# Patient Record
Sex: Female | Born: 1955 | Race: White | Hispanic: No | Marital: Married | Smoking: Never smoker
Health system: Southern US, Community
[De-identification: ages and names within clinical notes are randomized; demographics above are authoritative.]

## PROBLEM LIST (undated history)

## (undated) DIAGNOSIS — F32A Depression, unspecified: Secondary | ICD-10-CM

## (undated) DIAGNOSIS — H8110 Benign paroxysmal vertigo, unspecified ear: Secondary | ICD-10-CM

## (undated) DIAGNOSIS — K209 Esophagitis, unspecified without bleeding: Secondary | ICD-10-CM

## (undated) DIAGNOSIS — E1169 Type 2 diabetes mellitus with other specified complication: Secondary | ICD-10-CM

## (undated) DIAGNOSIS — I10 Essential (primary) hypertension: Secondary | ICD-10-CM

## (undated) DIAGNOSIS — G4733 Obstructive sleep apnea (adult) (pediatric): Secondary | ICD-10-CM

## (undated) DIAGNOSIS — K140 Glossitis: Secondary | ICD-10-CM

## (undated) DIAGNOSIS — J309 Allergic rhinitis, unspecified: Secondary | ICD-10-CM

## (undated) DIAGNOSIS — E119 Type 2 diabetes mellitus without complications: Secondary | ICD-10-CM

## (undated) DIAGNOSIS — M7582 Other shoulder lesions, left shoulder: Secondary | ICD-10-CM

## (undated) DIAGNOSIS — N6320 Unspecified lump in the left breast, unspecified quadrant: Secondary | ICD-10-CM

## (undated) DIAGNOSIS — M7710 Lateral epicondylitis, unspecified elbow: Secondary | ICD-10-CM

## (undated) DIAGNOSIS — M1990 Unspecified osteoarthritis, unspecified site: Secondary | ICD-10-CM

## (undated) DIAGNOSIS — K5792 Diverticulitis of intestine, part unspecified, without perforation or abscess without bleeding: Secondary | ICD-10-CM

## (undated) DIAGNOSIS — M5126 Other intervertebral disc displacement, lumbar region: Secondary | ICD-10-CM

## (undated) DIAGNOSIS — K219 Gastro-esophageal reflux disease without esophagitis: Secondary | ICD-10-CM

## (undated) DIAGNOSIS — E559 Vitamin D deficiency, unspecified: Secondary | ICD-10-CM

## (undated) DIAGNOSIS — I517 Cardiomegaly: Secondary | ICD-10-CM

## (undated) DIAGNOSIS — E7800 Pure hypercholesterolemia, unspecified: Secondary | ICD-10-CM

## (undated) DIAGNOSIS — F329 Major depressive disorder, single episode, unspecified: Secondary | ICD-10-CM

## (undated) HISTORY — DX: Other shoulder lesions, left shoulder: M75.82

## (undated) HISTORY — DX: Pure hypercholesterolemia, unspecified: E78.00

## (undated) HISTORY — DX: Gastro-esophageal reflux disease without esophagitis: K21.9

## (undated) HISTORY — DX: Glossitis: K14.0

## (undated) HISTORY — PX: CHOLECYSTECTOMY: SHX55

## (undated) HISTORY — DX: Depression, unspecified: F32.A

## (undated) HISTORY — DX: Other intervertebral disc displacement, lumbar region: M51.26

## (undated) HISTORY — DX: Lateral epicondylitis, unspecified elbow: M77.10

## (undated) HISTORY — DX: Obstructive sleep apnea (adult) (pediatric): G47.33

## (undated) HISTORY — DX: Allergic rhinitis, unspecified: J30.9

## (undated) HISTORY — DX: Cardiomegaly: I51.7

## (undated) HISTORY — DX: Diverticulitis of intestine, part unspecified, without perforation or abscess without bleeding: K57.92

## (undated) HISTORY — DX: Type 2 diabetes mellitus with other specified complication: E11.69

## (undated) HISTORY — DX: Benign paroxysmal vertigo, unspecified ear: H81.10

## (undated) HISTORY — DX: Vitamin D deficiency, unspecified: E55.9

## (undated) HISTORY — DX: Unspecified lump in the left breast, unspecified quadrant: N63.20

## (undated) HISTORY — DX: Esophagitis, unspecified without bleeding: K20.90

## (undated) HISTORY — PX: CARPAL TUNNEL RELEASE: SHX101

---
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
## 2011-06-29 ENCOUNTER — Encounter: Payer: Self-pay | Admitting: Interventional Cardiology

## 2011-12-06 ENCOUNTER — Ambulatory Visit (INDEPENDENT_AMBULATORY_CARE_PROVIDER_SITE_OTHER): Payer: BC Managed Care – PPO | Admitting: Family Medicine

## 2011-12-06 VITALS — BP 130/89 | HR 88 | Temp 99.0°F | Resp 16 | Ht 65.0 in | Wt 195.8 lb

## 2011-12-06 DIAGNOSIS — J019 Acute sinusitis, unspecified: Secondary | ICD-10-CM

## 2011-12-06 DIAGNOSIS — J329 Chronic sinusitis, unspecified: Secondary | ICD-10-CM

## 2011-12-06 MED ORDER — LEVOFLOXACIN 500 MG PO TABS: 500.0000 mg | ORAL_TABLET | Freq: Every day | ORAL | Status: AC

## 2011-12-06 NOTE — Progress Notes (Signed)
@[email or web address] @   Patient ID: Brittany Campos MRN: [id number], DOB: 04/17/1956, 56 y.o. Date of Encounter: 12/06/2011, 12:21 PM  Primary Physician: No primary provider on file.  Chief Complaint:  Chief Complaint  Patient presents with  . ear pressure    both ears  . sinus pressure    in head  . Sore Throat    HPI: 56 y.o. year old female presents with 21 day history of nasal congestion, post nasal drip, sore throat, sinus pressure, and cough. Afebrile. No chills. Nasal congestion thick and green/yellow. Sinus pressure is the worst symptom. Cough is productive secondary to post nasal drip and not associated with time of day. Ears feel full, leading to sensation of muffled hearing. Has tried OTC cold preps without success. No GI complaints. Appetite normal.   She was recently treated with Biaxin where she was located in [place], and subsequently had a minute clinic here in [place] with a Z-Pak. Neither of them made any difference nor did help in the past. She's had a history of sinus problems and at one point considered surgery on her sinuses   No leg trauma, sedentary periods, h/o cancer, or tobacco use.  No past medical history on file.   Home Meds: Prior to Admission medications   Medication Sig Start Date End Date Taking? Authorizing Provider  cetirizine-pseudoephedrine (ZYRTEC-D) 5-120 MG per tablet Take 1 tablet by mouth 2 (two) times daily.   Yes Historical Provider, MD  fluticasone (FLONASE) 50 MCG/ACT nasal spray Place 2 sprays into the nose as needed.   Yes Historical Provider, MD  lisinopril-hydrochlorothiazide (PRINZIDE,ZESTORETIC) 10-12.5 MG per tablet Take 1 tablet by mouth daily.   Yes Historical Provider, MD  Multiple Vitamins-Minerals (MULTIVITAMIN WITH MINERALS) tablet Take 1 tablet by mouth daily.   Yes Historical Provider, MD  omeprazole (PRILOSEC OTC) 20 MG tablet Take 20 mg by mouth daily.   Yes Historical Provider, MD  ramipril (ALTACE) 5 MG capsule Take 5 mg by mouth  daily.   Yes Historical Provider, MD    Allergies: Not on File  History   Social History  . Marital Status: Married    Spouse Name: N/A    Number of Children: N/A  . Years of Education: N/A   Occupational History  . Not on file.   Social History Main Topics  . Smoking status: Never Smoker   . Smokeless tobacco: Not on file  . Alcohol Use: Not on file  . Drug Use: Not on file  . Sexually Active: Not on file   Other Topics Concern  . Not on file   Social History Narrative  . No narrative on file     Review of Systems: Constitutional: negative for chills, fever, night sweats or weight changes Cardiovascular: negative for chest pain or palpitations Respiratory: negative for hemoptysis, wheezing, or shortness of breath Abdominal: negative for abdominal pain, nausea, vomiting or diarrhea Dermatological: negative for rash Neurologic: negative for headache   Physical Exam: Blood pressure 130/89, pulse 88, temperature 99 F (37.2 C), temperature source Oral, resp. rate 16, height 5\' 5"  (1.651 m), weight 195 lb 12.8 oz (88.814 kg), SpO2 98.00%., Body mass index is 32.58 kg/(m^2). General: Well developed, well nourished, in no acute distress. Head: Normocephalic, atraumatic, eyes without discharge, sclera non-icteric, nares are congested. Bilateral auditory canals clear, TM's are without perforation, pearly grey with reflective cone of light bilaterally. Serous effusion bilaterally behind TM's. Maxillary sinus TTP. Oral cavity moist, dentition normal. Posterior pharynx with post nasal drip and  mild erythema. No peritonsillar abscess or tonsillar exudate. Neck: Supple. No thyromegaly. Full ROM. No lymphadenopathy. Lungs: Clear bilaterally to auscultation without wheezes, rales, or rhonchi. Breathing is unlabored.  Heart: RRR with S1 S2. No murmurs, rubs, or gallops appreciated. Msk:  Strength and tone normal for age. Extremities: No clubbing or cyanosis. No edema. Neuro: Alert  and oriented X 3. Moves all extremities spontaneously. CNII-XII grossly in tact. Psych:  Responds to questions appropriately with a normal affect.   Labs:   ASSESSMENT AND PLAN:  56 y.o. year old female with sinusitis, chronic and ongoing after 2 rounds of macrolide antibiotics. She has not responded to Flonase and I believe she needs a stronger antibiotic. - -Continue Flonase -Tylenol/Motrin prn -Rest/fluids 1. Sinusitis  levofloxacin (LEVAQUIN) 500 MG tablet  If no better, start prednisone 20 mg ii daily with food x 5 days  Signed, Elvina Sidle, MD 12/06/2011 12:21 PM   .

## 2012-01-31 ENCOUNTER — Other Ambulatory Visit (HOSPITAL_COMMUNITY)
Admission: RE | Admit: 2012-01-31 | Discharge: 2012-01-31 | Disposition: A | Discharge status: Home / Self Care | Payer: BC Managed Care – PPO | Source: Ambulatory Visit | Attending: Family Medicine | Admitting: Family Medicine

## 2012-01-31 DIAGNOSIS — Z124 Encounter for screening for malignant neoplasm of cervix: Secondary | ICD-10-CM | POA: Insufficient documentation

## 2012-01-31 DIAGNOSIS — Z1151 Encounter for screening for human papillomavirus (HPV): Secondary | ICD-10-CM | POA: Insufficient documentation

## 2012-02-01 ENCOUNTER — Other Ambulatory Visit: Payer: Self-pay | Admitting: Family Medicine

## 2012-02-01 DIAGNOSIS — Z1231 Encounter for screening mammogram for malignant neoplasm of breast: Secondary | ICD-10-CM

## 2012-02-17 ENCOUNTER — Ambulatory Visit
Admission: RE | Admit: 2012-02-17 | Discharge: 2012-02-17 | Disposition: A | Discharge status: Home / Self Care | Payer: BC Managed Care – PPO | Source: Ambulatory Visit | Attending: Family Medicine | Admitting: Family Medicine

## 2012-02-17 DIAGNOSIS — Z1231 Encounter for screening mammogram for malignant neoplasm of breast: Secondary | ICD-10-CM

## 2012-02-21 ENCOUNTER — Other Ambulatory Visit: Payer: Self-pay | Admitting: Family Medicine

## 2012-02-21 DIAGNOSIS — R928 Other abnormal and inconclusive findings on diagnostic imaging of breast: Secondary | ICD-10-CM

## 2012-02-24 ENCOUNTER — Ambulatory Visit
Admission: RE | Admit: 2012-02-24 | Discharge: 2012-02-24 | Disposition: A | Discharge status: Home / Self Care | Payer: BC Managed Care – PPO | Source: Ambulatory Visit | Attending: Family Medicine | Admitting: Family Medicine

## 2012-02-24 DIAGNOSIS — R928 Other abnormal and inconclusive findings on diagnostic imaging of breast: Secondary | ICD-10-CM

## 2012-07-01 ENCOUNTER — Encounter (HOSPITAL_COMMUNITY): Payer: Self-pay

## 2012-07-01 ENCOUNTER — Encounter: Payer: Self-pay | Admitting: Interventional Cardiology

## 2012-07-01 ENCOUNTER — Emergency Department (HOSPITAL_COMMUNITY)
Admission: EM | Admit: 2012-07-01 | Discharge: 2012-07-01 | Disposition: A | Discharge status: Home / Self Care | Payer: BC Managed Care – PPO | Attending: Emergency Medicine | Admitting: Emergency Medicine

## 2012-07-01 DIAGNOSIS — I10 Essential (primary) hypertension: Secondary | ICD-10-CM | POA: Insufficient documentation

## 2012-07-01 DIAGNOSIS — Z79899 Other long term (current) drug therapy: Secondary | ICD-10-CM | POA: Insufficient documentation

## 2012-07-01 DIAGNOSIS — IMO0002 Reserved for concepts with insufficient information to code with codable children: Secondary | ICD-10-CM | POA: Insufficient documentation

## 2012-07-01 DIAGNOSIS — R42 Dizziness and giddiness: Secondary | ICD-10-CM | POA: Insufficient documentation

## 2012-07-01 DIAGNOSIS — R05 Cough: Secondary | ICD-10-CM | POA: Insufficient documentation

## 2012-07-01 DIAGNOSIS — R059 Cough, unspecified: Secondary | ICD-10-CM | POA: Insufficient documentation

## 2012-07-01 DIAGNOSIS — M129 Arthropathy, unspecified: Secondary | ICD-10-CM | POA: Insufficient documentation

## 2012-07-01 DIAGNOSIS — N390 Urinary tract infection, site not specified: Secondary | ICD-10-CM | POA: Insufficient documentation

## 2012-07-01 DIAGNOSIS — E119 Type 2 diabetes mellitus without complications: Secondary | ICD-10-CM | POA: Insufficient documentation

## 2012-07-01 DIAGNOSIS — E876 Hypokalemia: Secondary | ICD-10-CM

## 2012-07-01 HISTORY — DX: Essential (primary) hypertension: I10

## 2012-07-01 HISTORY — DX: Unspecified osteoarthritis, unspecified site: M19.90

## 2012-07-01 HISTORY — DX: Type 2 diabetes mellitus without complications: E11.9

## 2012-07-01 LAB — CBC WITH DIFFERENTIAL/PLATELET
Eosinophils Absolute: 0.1 10*3/uL (ref 0.0–0.7)
Lymphs Abs: 2.6 10*3/uL (ref 0.7–4.0)
MCH: 30.6 pg (ref 26.0–34.0)
Neutrophils Relative %: 60 % (ref 43–77)
Platelets: 240 10*3/uL (ref 150–400)
RBC: 4.38 MIL/uL (ref 3.87–5.11)
WBC: 8.2 10*3/uL (ref 4.0–10.5)

## 2012-07-01 LAB — COMPREHENSIVE METABOLIC PANEL
ALT: 32 U/L (ref 0–35)
AST: 25 U/L (ref 0–37)
Albumin: 3.6 g/dL (ref 3.5–5.2)
Alkaline Phosphatase: 71 U/L (ref 39–117)
Glucose, Bld: 173 mg/dL — ABNORMAL HIGH (ref 70–99)
Potassium: 3 mEq/L — ABNORMAL LOW (ref 3.5–5.1)
Sodium: 134 mEq/L — ABNORMAL LOW (ref 135–145)
Total Protein: 7.2 g/dL (ref 6.0–8.3)

## 2012-07-01 LAB — POCT I-STAT TROPONIN I: Troponin i, poc: 0 ng/mL (ref 0.00–0.08)

## 2012-07-01 LAB — URINALYSIS, ROUTINE W REFLEX MICROSCOPIC
Bilirubin Urine: NEGATIVE
Glucose, UA: NEGATIVE mg/dL
Hgb urine dipstick: NEGATIVE
Ketones, ur: 15 mg/dL — AB
pH: 6 (ref 5.0–8.0)

## 2012-07-01 LAB — URINE MICROSCOPIC-ADD ON

## 2012-07-01 MED ORDER — CIPROFLOXACIN HCL 500 MG PO TABS: 500.0000 mg | ORAL_TABLET | Freq: Two times a day (BID) | ORAL | Status: DC

## 2012-07-01 MED ORDER — POTASSIUM CHLORIDE CRYS ER 20 MEQ PO TBCR
10.0000 meq | EXTENDED_RELEASE_TABLET | Freq: Once | ORAL | Status: AC
  Administered 2012-07-01: 10 meq via ORAL
  Filled 2012-07-01: qty 1

## 2012-07-01 MED ORDER — POTASSIUM CHLORIDE ER 10 MEQ PO TBCR: 10.0000 meq | EXTENDED_RELEASE_TABLET | Freq: Two times a day (BID) | ORAL | Status: DC

## 2012-07-01 MED ORDER — CIPROFLOXACIN HCL 500 MG PO TABS
500.0000 mg | ORAL_TABLET | Freq: Once | ORAL | Status: AC
  Administered 2012-07-01: 500 mg via ORAL
  Filled 2012-07-01: qty 1

## 2012-07-01 NOTE — ED Notes (Signed)
Dr. Ignacia Palma notified pt's potassium 3.0.

## 2012-07-01 NOTE — ED Notes (Signed)
Pt went to her PCP 9 days ago and was diagnosed with a viral infection and gave prescriptions.  Pt presented again today for worsening symptoms.  Dr. Clarene Duke from [facility] walk in clinic called and stated that pt's CXR showed cardiomegaly.  Pt sent here for further eval.  Pt alert and oriented and in NAD.

## 2012-07-01 NOTE — ED Notes (Signed)
Pt c/o worsening fatigue. Was recently diagnosed with viral illness sx have been getting worse instead of better.

## 2012-07-01 NOTE — ED Provider Notes (Signed)
History     CSN: [id number]  Arrival date & time 07/01/12  1656   First MD Initiated Contact with Patient 07/01/12 1752      Chief Complaint  Patient presents with  . Shortness of Breath  . Dizziness    (Consider location/radiation/quality/duration/timing/severity/associated sxs/prior treatment) Patient is a 57 y.o. female presenting with shortness of breath. The history is provided by the patient (Records from [facility] No language interpreter was used.  Shortness of Breath  The current episode started 3 to 5 days ago. The onset was gradual. Episode frequency: Patient is a 58 year old woman who had upper respiratory infection.9 days ago treated with a Z-Pak. For 2 days she's had progressively worsening shortness of breath. The problem has been gradually worsening. The problem is moderate. Nothing relieves the symptoms. Nothing aggravates the symptoms. Associated symptoms include cough and shortness of breath. Pertinent negatives include no chest pain and no fever. Associated symptoms comments: There was no fever, mild cough. She tends to rest sitting up so it is impossible to know if she has orthopnea or paroxysmal nocturnal dyspnea.. She has not inhaled smoke recently. She has had no prior hospitalizations. She has had no prior ICU admissions. She has had no prior intubations. Her past medical history does not include asthma or past wheezing. Recently, medical care has been given by the PCP. Services received include medications given.    Past Medical History  Diagnosis Date  . Arthritis   . Diabetes mellitus without complication   . Hypertension     Past Surgical History  Procedure Date  . Cholecystectomy   . Carpal tunnel release     No family history on file.  History  Substance Use Topics  . Smoking status: Never Smoker   . Smokeless tobacco: Not on file  . Alcohol Use: Yes    OB History    Grav Para Term Preterm Abortions TAB SAB Ect Mult Living           Review of Systems  Constitutional: Negative.  Negative for fever and chills.  HENT: Negative.   Eyes: Negative.   Respiratory: Positive for cough and shortness of breath.   Cardiovascular: Negative.  Negative for chest pain and leg swelling.  Gastrointestinal: Negative.   Genitourinary: Negative.   Musculoskeletal: Negative.   Skin: Negative.   Neurological: Negative.   Psychiatric/Behavioral: Negative.     Allergies  Ampicillin  Home Medications   Current Outpatient Rx  Name  Route  Sig  Dispense  Refill  . VITAMIN C 1000 MG PO TABS   Oral   Take 1,000 mg by mouth daily.         Marland Kitchen FLUOXETINE HCL 20 MG PO CAPS   Oral   Take 20 mg by mouth daily.         Marland Kitchen FLUTICASONE PROPIONATE 50 MCG/ACT NA SUSP   Nasal   Place 2 sprays into the nose as needed. For stuffiness         . IBUPROFEN 200 MG PO TABS   Oral   Take 400-600 mg by mouth 2 (two) times daily as needed. For pain         . LISINOPRIL-HYDROCHLOROTHIAZIDE 20-12.5 MG PO TABS   Oral   Take 2 tablets by mouth daily.         . MELOXICAM 15 MG PO TABS   Oral   Take 15 mg by mouth daily.         . MULTI-VITAMIN/MINERALS PO  TABS   Oral   Take 1 tablet by mouth daily.         Marland Kitchen OMEPRAZOLE MAGNESIUM 20 MG PO TBEC   Oral   Take 20 mg by mouth 2 (two) times daily as needed. For acid reflux         . PSEUDOEPHEDRINE-GUAIFENESIN ER 60-600 MG PO TB12   Oral   Take 1 tablet by mouth 2 (two) times daily as needed. For congestion         . VITAMIN B-6 PO   Oral   Take 2 tablets by mouth daily.         Marland Kitchen VERAPAMIL HCL 80 MG PO TABS   Oral   Take 80 mg by mouth daily.           BP 139/87  Pulse 108  Temp 98.1 F (36.7 C) (Oral)  Resp 16  SpO2 98%  Physical Exam  Nursing note and vitals reviewed. Constitutional: She is oriented to person, place, and time.       Latest woman, no distress at rest.  HENT:  Head: Normocephalic and atraumatic.  Right Ear: External ear normal.    Left Ear: External ear normal.  Mouth/Throat: Oropharynx is clear and moist.  Eyes: Conjunctivae normal and EOM are normal. Pupils are equal, round, and reactive to light. No scleral icterus.  Neck: Normal range of motion.  Cardiovascular: Normal rate, regular rhythm and normal heart sounds.   Pulmonary/Chest: Effort normal and breath sounds normal. She has no wheezes. She has no rales.  Abdominal: Soft. Bowel sounds are normal.  Musculoskeletal: She exhibits no edema.  Neurological: She is alert and oriented to person, place, and time.       No sensory or motor deficit.  Skin: Skin is warm and dry.  Psychiatric: She has a normal mood and affect. Her behavior is normal.    ED Course  Procedures (including critical care time)  5:53 PM  Date: 07/01/2012  Rate: 101  Rhythm: sinus tachycardia  QRS Axis: normal  Intervals: normal QRS:  Poor R wave progression in precordial leads suggests possible old anterior myocardial infarction.  ST/T Wave abnormalities: normal  Conduction Disutrbances:none  Narrative Interpretation: Abnormal EKG  Old EKG Reviewed: none available  6:19 PM Patient was seen and had physical examination. Laboratory tests were ordered.  8:26 PM Results for orders placed during the hospital encounter of 07/01/12  CBC WITH DIFFERENTIAL      Component Value Range   WBC 8.2  4.0 - 10.5 K/uL   RBC 4.38  3.87 - 5.11 MIL/uL   Hemoglobin 13.4  12.0 - 15.0 g/dL   HCT 62.1  30.8 - 65.7 %   MCV 86.5  78.0 - 100.0 fL   MCH 30.6  26.0 - 34.0 pg   MCHC 35.4  30.0 - 36.0 g/dL   RDW 84.6  96.2 - 95.2 %   Platelets 240  150 - 400 K/uL   Neutrophils Relative 60  43 - 77 %   Neutro Abs 4.9  1.7 - 7.7 K/uL   Lymphocytes Relative 32  12 - 46 %   Lymphs Abs 2.6  0.7 - 4.0 K/uL   Monocytes Relative 7  3 - 12 %   Monocytes Absolute 0.5  0.1 - 1.0 K/uL   Eosinophils Relative 2  0 - 5 %   Eosinophils Absolute 0.1  0.0 - 0.7 K/uL   Basophils Relative 0  0 - 1 %   Basophils  Absolute 0.0  0.0 - 0.1 K/uL  COMPREHENSIVE METABOLIC PANEL      Component Value Range   Sodium 134 (*) 135 - 145 mEq/L   Potassium 3.0 (*) 3.5 - 5.1 mEq/L   Chloride 98  96 - 112 mEq/L   CO2 25  19 - 32 mEq/L   Glucose, Bld 173 (*) 70 - 99 mg/dL   BUN 23  6 - 23 mg/dL   Creatinine, Ser 1.61  0.50 - 1.10 mg/dL   Calcium 9.4  8.4 - 09.6 mg/dL   Total Protein 7.2  6.0 - 8.3 g/dL   Albumin 3.6  3.5 - 5.2 g/dL   AST 25  0 - 37 U/L   ALT 32  0 - 35 U/L   Alkaline Phosphatase 71  39 - 117 U/L   Total Bilirubin 0.4  0.3 - 1.2 mg/dL   GFR calc non Af Amer 61 (*) >90 mL/min   GFR calc Af Amer 71 (*) >90 mL/min  URINALYSIS, ROUTINE W REFLEX MICROSCOPIC      Component Value Range   Color, Urine YELLOW  YELLOW   APPearance CLOUDY (*) CLEAR   Specific Gravity, Urine 1.030  1.005 - 1.030   pH 6.0  5.0 - 8.0   Glucose, UA NEGATIVE  NEGATIVE mg/dL   Hgb urine dipstick NEGATIVE  NEGATIVE   Bilirubin Urine NEGATIVE  NEGATIVE   Ketones, ur 15 (*) NEGATIVE mg/dL   Protein, ur NEGATIVE  NEGATIVE mg/dL   Urobilinogen, UA 0.2  0.0 - 1.0 mg/dL   Nitrite NEGATIVE  NEGATIVE   Leukocytes, UA MODERATE (*) NEGATIVE  PRO B NATRIURETIC PEPTIDE      Component Value Range   Pro B Natriuretic peptide (BNP) 91.8  0 - 125 pg/mL  D-DIMER, QUANTITATIVE      Component Value Range   D-Dimer, Quant 0.28  0.00 - 0.48 ug/mL-FEU  POCT I-STAT TROPONIN I      Component Value Range   Troponin i, poc 0.00  0.00 - 0.08 ng/mL   Comment 3           URINE MICROSCOPIC-ADD ON      Component Value Range   Squamous Epithelial / LPF MANY (*) RARE   WBC, UA TOO NUMEROUS TO COUNT  <3 WBC/hpf   RBC / HPF 0-2  <3 RBC/hpf   Bacteria, UA FEW (*) RARE   Casts GRANULAR CAST (*) NEGATIVE    Discussed pt's lab results with her and her husband.  Rx Cipro for UTI, KCL for hypokalemia, F/U with Dr. Ihor Dow, her PCP, next week.     1. UTI (lower urinary tract infection)   2. Hypokalemia             Carleene Cooper III,  MD 07/01/12 2043

## 2012-07-02 LAB — URINE CULTURE

## 2013-04-10 ENCOUNTER — Other Ambulatory Visit: Payer: Self-pay

## 2013-04-10 DIAGNOSIS — Z1231 Encounter for screening mammogram for malignant neoplasm of breast: Secondary | ICD-10-CM

## 2013-04-16 ENCOUNTER — Ambulatory Visit
Admission: RE | Admit: 2013-04-16 | Discharge: 2013-04-16 | Disposition: A | Discharge status: Home / Self Care | Payer: BC Managed Care – PPO | Source: Ambulatory Visit

## 2013-04-16 DIAGNOSIS — Z1231 Encounter for screening mammogram for malignant neoplasm of breast: Secondary | ICD-10-CM

## 2014-03-18 ENCOUNTER — Other Ambulatory Visit: Payer: Self-pay

## 2014-03-18 DIAGNOSIS — Z1239 Encounter for other screening for malignant neoplasm of breast: Secondary | ICD-10-CM

## 2014-04-17 ENCOUNTER — Ambulatory Visit: Payer: BC Managed Care – PPO

## 2014-04-28 ENCOUNTER — Ambulatory Visit: Payer: BC Managed Care – PPO

## 2014-05-07 ENCOUNTER — Ambulatory Visit
Admission: RE | Admit: 2014-05-07 | Discharge: 2014-05-07 | Disposition: A | Discharge status: Home / Self Care | Payer: BC Managed Care – PPO | Source: Ambulatory Visit

## 2014-05-07 DIAGNOSIS — Z1239 Encounter for other screening for malignant neoplasm of breast: Secondary | ICD-10-CM

## 2014-06-26 ENCOUNTER — Other Ambulatory Visit: Payer: Self-pay | Admitting: Orthopedic Surgery

## 2014-06-26 DIAGNOSIS — S92312A Displaced fracture of first metatarsal bone, left foot, initial encounter for closed fracture: Secondary | ICD-10-CM

## 2014-06-27 ENCOUNTER — Other Ambulatory Visit: Payer: Self-pay

## 2014-06-27 ENCOUNTER — Other Ambulatory Visit: Payer: Self-pay | Admitting: Orthopedic Surgery

## 2014-06-27 ENCOUNTER — Ambulatory Visit
Admission: RE | Admit: 2014-06-27 | Discharge: 2014-06-27 | Disposition: A | Discharge status: Home / Self Care | Payer: BLUE CROSS/BLUE SHIELD | Source: Ambulatory Visit | Attending: Orthopedic Surgery | Admitting: Orthopedic Surgery

## 2014-06-27 DIAGNOSIS — S92312A Displaced fracture of first metatarsal bone, left foot, initial encounter for closed fracture: Secondary | ICD-10-CM

## 2015-06-29 ENCOUNTER — Other Ambulatory Visit (HOSPITAL_COMMUNITY)
Admission: RE | Admit: 2015-06-29 | Discharge: 2015-06-29 | Disposition: A | Discharge status: Home / Self Care | Payer: BLUE CROSS/BLUE SHIELD | Source: Ambulatory Visit | Attending: Family Medicine | Admitting: Family Medicine

## 2015-06-29 ENCOUNTER — Other Ambulatory Visit: Payer: Self-pay | Admitting: Family Medicine

## 2015-06-29 DIAGNOSIS — Z124 Encounter for screening for malignant neoplasm of cervix: Secondary | ICD-10-CM | POA: Diagnosis present

## 2015-06-29 DIAGNOSIS — Z1151 Encounter for screening for human papillomavirus (HPV): Secondary | ICD-10-CM | POA: Diagnosis not present

## 2015-07-02 LAB — CYTOLOGY - PAP

## 2015-07-08 ENCOUNTER — Other Ambulatory Visit: Payer: Self-pay

## 2015-07-08 DIAGNOSIS — Z1231 Encounter for screening mammogram for malignant neoplasm of breast: Secondary | ICD-10-CM

## 2015-07-20 ENCOUNTER — Ambulatory Visit: Payer: BLUE CROSS/BLUE SHIELD

## 2015-09-15 DIAGNOSIS — M5386 Other specified dorsopathies, lumbar region: Secondary | ICD-10-CM | POA: Diagnosis not present

## 2015-09-15 DIAGNOSIS — M9901 Segmental and somatic dysfunction of cervical region: Secondary | ICD-10-CM | POA: Diagnosis not present

## 2015-09-15 DIAGNOSIS — M9903 Segmental and somatic dysfunction of lumbar region: Secondary | ICD-10-CM | POA: Diagnosis not present

## 2015-09-15 DIAGNOSIS — M9902 Segmental and somatic dysfunction of thoracic region: Secondary | ICD-10-CM | POA: Diagnosis not present

## 2015-09-15 DIAGNOSIS — M9906 Segmental and somatic dysfunction of lower extremity: Secondary | ICD-10-CM | POA: Diagnosis not present

## 2015-09-17 DIAGNOSIS — F341 Dysthymic disorder: Secondary | ICD-10-CM | POA: Diagnosis not present

## 2015-09-21 DIAGNOSIS — M5386 Other specified dorsopathies, lumbar region: Secondary | ICD-10-CM | POA: Diagnosis not present

## 2015-09-21 DIAGNOSIS — M65312 Trigger thumb, left thumb: Secondary | ICD-10-CM | POA: Diagnosis not present

## 2015-09-21 DIAGNOSIS — M9907 Segmental and somatic dysfunction of upper extremity: Secondary | ICD-10-CM | POA: Diagnosis not present

## 2015-09-24 DIAGNOSIS — M5137 Other intervertebral disc degeneration, lumbosacral region: Secondary | ICD-10-CM | POA: Diagnosis not present

## 2015-09-24 DIAGNOSIS — M9907 Segmental and somatic dysfunction of upper extremity: Secondary | ICD-10-CM | POA: Diagnosis not present

## 2015-09-24 DIAGNOSIS — M65312 Trigger thumb, left thumb: Secondary | ICD-10-CM | POA: Diagnosis not present

## 2015-09-24 DIAGNOSIS — M5386 Other specified dorsopathies, lumbar region: Secondary | ICD-10-CM | POA: Diagnosis not present

## 2015-09-28 DIAGNOSIS — M9901 Segmental and somatic dysfunction of cervical region: Secondary | ICD-10-CM | POA: Diagnosis not present

## 2015-09-28 DIAGNOSIS — M9907 Segmental and somatic dysfunction of upper extremity: Secondary | ICD-10-CM | POA: Diagnosis not present

## 2015-09-28 DIAGNOSIS — M9902 Segmental and somatic dysfunction of thoracic region: Secondary | ICD-10-CM | POA: Diagnosis not present

## 2015-09-28 DIAGNOSIS — M65312 Trigger thumb, left thumb: Secondary | ICD-10-CM | POA: Diagnosis not present

## 2015-09-28 DIAGNOSIS — M5386 Other specified dorsopathies, lumbar region: Secondary | ICD-10-CM | POA: Diagnosis not present

## 2015-09-28 DIAGNOSIS — M5137 Other intervertebral disc degeneration, lumbosacral region: Secondary | ICD-10-CM | POA: Diagnosis not present

## 2015-09-28 DIAGNOSIS — M9903 Segmental and somatic dysfunction of lumbar region: Secondary | ICD-10-CM | POA: Diagnosis not present

## 2015-10-02 ENCOUNTER — Ambulatory Visit
Admission: RE | Admit: 2015-10-02 | Discharge: 2015-10-02 | Disposition: A | Discharge status: Home / Self Care | Payer: BLUE CROSS/BLUE SHIELD | Source: Ambulatory Visit

## 2015-10-02 DIAGNOSIS — Z1231 Encounter for screening mammogram for malignant neoplasm of breast: Secondary | ICD-10-CM

## 2015-10-14 DIAGNOSIS — M5137 Other intervertebral disc degeneration, lumbosacral region: Secondary | ICD-10-CM | POA: Diagnosis not present

## 2015-10-14 DIAGNOSIS — M9901 Segmental and somatic dysfunction of cervical region: Secondary | ICD-10-CM | POA: Diagnosis not present

## 2015-10-14 DIAGNOSIS — M5386 Other specified dorsopathies, lumbar region: Secondary | ICD-10-CM | POA: Diagnosis not present

## 2015-10-14 DIAGNOSIS — F341 Dysthymic disorder: Secondary | ICD-10-CM | POA: Diagnosis not present

## 2015-10-14 DIAGNOSIS — M9907 Segmental and somatic dysfunction of upper extremity: Secondary | ICD-10-CM | POA: Diagnosis not present

## 2015-10-14 DIAGNOSIS — M9902 Segmental and somatic dysfunction of thoracic region: Secondary | ICD-10-CM | POA: Diagnosis not present

## 2015-10-14 DIAGNOSIS — M65312 Trigger thumb, left thumb: Secondary | ICD-10-CM | POA: Diagnosis not present

## 2015-10-20 DIAGNOSIS — M5137 Other intervertebral disc degeneration, lumbosacral region: Secondary | ICD-10-CM | POA: Diagnosis not present

## 2015-10-20 DIAGNOSIS — M9907 Segmental and somatic dysfunction of upper extremity: Secondary | ICD-10-CM | POA: Diagnosis not present

## 2015-10-20 DIAGNOSIS — M5386 Other specified dorsopathies, lumbar region: Secondary | ICD-10-CM | POA: Diagnosis not present

## 2015-10-20 DIAGNOSIS — M9901 Segmental and somatic dysfunction of cervical region: Secondary | ICD-10-CM | POA: Diagnosis not present

## 2015-10-20 DIAGNOSIS — M9902 Segmental and somatic dysfunction of thoracic region: Secondary | ICD-10-CM | POA: Diagnosis not present

## 2015-10-28 DIAGNOSIS — F341 Dysthymic disorder: Secondary | ICD-10-CM | POA: Diagnosis not present

## 2015-10-30 DIAGNOSIS — M65312 Trigger thumb, left thumb: Secondary | ICD-10-CM | POA: Diagnosis not present

## 2015-10-30 DIAGNOSIS — M5386 Other specified dorsopathies, lumbar region: Secondary | ICD-10-CM | POA: Diagnosis not present

## 2015-10-30 DIAGNOSIS — M9902 Segmental and somatic dysfunction of thoracic region: Secondary | ICD-10-CM | POA: Diagnosis not present

## 2015-10-30 DIAGNOSIS — M9907 Segmental and somatic dysfunction of upper extremity: Secondary | ICD-10-CM | POA: Diagnosis not present

## 2015-10-30 DIAGNOSIS — M5137 Other intervertebral disc degeneration, lumbosacral region: Secondary | ICD-10-CM | POA: Diagnosis not present

## 2015-11-16 DIAGNOSIS — M9901 Segmental and somatic dysfunction of cervical region: Secondary | ICD-10-CM | POA: Diagnosis not present

## 2015-11-16 DIAGNOSIS — M5386 Other specified dorsopathies, lumbar region: Secondary | ICD-10-CM | POA: Diagnosis not present

## 2015-11-16 DIAGNOSIS — M9907 Segmental and somatic dysfunction of upper extremity: Secondary | ICD-10-CM | POA: Diagnosis not present

## 2015-11-16 DIAGNOSIS — M9902 Segmental and somatic dysfunction of thoracic region: Secondary | ICD-10-CM | POA: Diagnosis not present

## 2015-11-16 DIAGNOSIS — M5137 Other intervertebral disc degeneration, lumbosacral region: Secondary | ICD-10-CM | POA: Diagnosis not present

## 2015-11-22 DIAGNOSIS — J019 Acute sinusitis, unspecified: Secondary | ICD-10-CM | POA: Diagnosis not present

## 2015-11-28 DIAGNOSIS — F341 Dysthymic disorder: Secondary | ICD-10-CM | POA: Diagnosis not present

## 2015-12-04 DIAGNOSIS — M9901 Segmental and somatic dysfunction of cervical region: Secondary | ICD-10-CM | POA: Diagnosis not present

## 2015-12-04 DIAGNOSIS — M47811 Spondylosis without myelopathy or radiculopathy, occipito-atlanto-axial region: Secondary | ICD-10-CM | POA: Diagnosis not present

## 2015-12-04 DIAGNOSIS — M65312 Trigger thumb, left thumb: Secondary | ICD-10-CM | POA: Diagnosis not present

## 2015-12-04 DIAGNOSIS — M5386 Other specified dorsopathies, lumbar region: Secondary | ICD-10-CM | POA: Diagnosis not present

## 2015-12-04 DIAGNOSIS — M47815 Spondylosis without myelopathy or radiculopathy, thoracolumbar region: Secondary | ICD-10-CM | POA: Diagnosis not present

## 2015-12-04 DIAGNOSIS — M9902 Segmental and somatic dysfunction of thoracic region: Secondary | ICD-10-CM | POA: Diagnosis not present

## 2015-12-09 DIAGNOSIS — J019 Acute sinusitis, unspecified: Secondary | ICD-10-CM | POA: Diagnosis not present

## 2015-12-23 DIAGNOSIS — F341 Dysthymic disorder: Secondary | ICD-10-CM | POA: Diagnosis not present

## 2016-01-29 DIAGNOSIS — M65312 Trigger thumb, left thumb: Secondary | ICD-10-CM | POA: Diagnosis not present

## 2016-01-29 DIAGNOSIS — M5137 Other intervertebral disc degeneration, lumbosacral region: Secondary | ICD-10-CM | POA: Diagnosis not present

## 2016-01-29 DIAGNOSIS — M9901 Segmental and somatic dysfunction of cervical region: Secondary | ICD-10-CM | POA: Diagnosis not present

## 2016-01-29 DIAGNOSIS — M9903 Segmental and somatic dysfunction of lumbar region: Secondary | ICD-10-CM | POA: Diagnosis not present

## 2016-01-29 DIAGNOSIS — M5386 Other specified dorsopathies, lumbar region: Secondary | ICD-10-CM | POA: Diagnosis not present

## 2016-01-29 DIAGNOSIS — M47811 Spondylosis without myelopathy or radiculopathy, occipito-atlanto-axial region: Secondary | ICD-10-CM | POA: Diagnosis not present

## 2016-02-03 DIAGNOSIS — F341 Dysthymic disorder: Secondary | ICD-10-CM | POA: Diagnosis not present

## 2016-02-04 DIAGNOSIS — M5386 Other specified dorsopathies, lumbar region: Secondary | ICD-10-CM | POA: Diagnosis not present

## 2016-02-04 DIAGNOSIS — M9903 Segmental and somatic dysfunction of lumbar region: Secondary | ICD-10-CM | POA: Diagnosis not present

## 2016-02-04 DIAGNOSIS — M9902 Segmental and somatic dysfunction of thoracic region: Secondary | ICD-10-CM | POA: Diagnosis not present

## 2016-02-04 DIAGNOSIS — M47815 Spondylosis without myelopathy or radiculopathy, thoracolumbar region: Secondary | ICD-10-CM | POA: Diagnosis not present

## 2016-02-04 DIAGNOSIS — M65312 Trigger thumb, left thumb: Secondary | ICD-10-CM | POA: Diagnosis not present

## 2016-02-04 DIAGNOSIS — M47811 Spondylosis without myelopathy or radiculopathy, occipito-atlanto-axial region: Secondary | ICD-10-CM | POA: Diagnosis not present

## 2016-02-04 DIAGNOSIS — M9901 Segmental and somatic dysfunction of cervical region: Secondary | ICD-10-CM | POA: Diagnosis not present

## 2016-02-12 DIAGNOSIS — M9907 Segmental and somatic dysfunction of upper extremity: Secondary | ICD-10-CM | POA: Diagnosis not present

## 2016-02-12 DIAGNOSIS — M9901 Segmental and somatic dysfunction of cervical region: Secondary | ICD-10-CM | POA: Diagnosis not present

## 2016-02-12 DIAGNOSIS — M47815 Spondylosis without myelopathy or radiculopathy, thoracolumbar region: Secondary | ICD-10-CM | POA: Diagnosis not present

## 2016-02-12 DIAGNOSIS — M65312 Trigger thumb, left thumb: Secondary | ICD-10-CM | POA: Diagnosis not present

## 2016-02-12 DIAGNOSIS — M47811 Spondylosis without myelopathy or radiculopathy, occipito-atlanto-axial region: Secondary | ICD-10-CM | POA: Diagnosis not present

## 2016-02-12 DIAGNOSIS — M9903 Segmental and somatic dysfunction of lumbar region: Secondary | ICD-10-CM | POA: Diagnosis not present

## 2016-02-12 DIAGNOSIS — M5137 Other intervertebral disc degeneration, lumbosacral region: Secondary | ICD-10-CM | POA: Diagnosis not present

## 2016-02-12 DIAGNOSIS — M9902 Segmental and somatic dysfunction of thoracic region: Secondary | ICD-10-CM | POA: Diagnosis not present

## 2016-02-12 DIAGNOSIS — M5386 Other specified dorsopathies, lumbar region: Secondary | ICD-10-CM | POA: Diagnosis not present

## 2016-03-01 DIAGNOSIS — F341 Dysthymic disorder: Secondary | ICD-10-CM | POA: Diagnosis not present

## 2016-03-03 DIAGNOSIS — M65312 Trigger thumb, left thumb: Secondary | ICD-10-CM | POA: Diagnosis not present

## 2016-03-03 DIAGNOSIS — M9901 Segmental and somatic dysfunction of cervical region: Secondary | ICD-10-CM | POA: Diagnosis not present

## 2016-03-03 DIAGNOSIS — M47811 Spondylosis without myelopathy or radiculopathy, occipito-atlanto-axial region: Secondary | ICD-10-CM | POA: Diagnosis not present

## 2016-03-03 DIAGNOSIS — M9902 Segmental and somatic dysfunction of thoracic region: Secondary | ICD-10-CM | POA: Diagnosis not present

## 2016-03-03 DIAGNOSIS — M5386 Other specified dorsopathies, lumbar region: Secondary | ICD-10-CM | POA: Diagnosis not present

## 2016-03-03 DIAGNOSIS — M9903 Segmental and somatic dysfunction of lumbar region: Secondary | ICD-10-CM | POA: Diagnosis not present

## 2016-03-03 DIAGNOSIS — M47815 Spondylosis without myelopathy or radiculopathy, thoracolumbar region: Secondary | ICD-10-CM | POA: Diagnosis not present

## 2016-03-09 DIAGNOSIS — F341 Dysthymic disorder: Secondary | ICD-10-CM | POA: Diagnosis not present

## 2016-03-29 DIAGNOSIS — M9901 Segmental and somatic dysfunction of cervical region: Secondary | ICD-10-CM | POA: Diagnosis not present

## 2016-03-29 DIAGNOSIS — M9903 Segmental and somatic dysfunction of lumbar region: Secondary | ICD-10-CM | POA: Diagnosis not present

## 2016-03-29 DIAGNOSIS — M5386 Other specified dorsopathies, lumbar region: Secondary | ICD-10-CM | POA: Diagnosis not present

## 2016-03-29 DIAGNOSIS — M9907 Segmental and somatic dysfunction of upper extremity: Secondary | ICD-10-CM | POA: Diagnosis not present

## 2016-03-29 DIAGNOSIS — M5137 Other intervertebral disc degeneration, lumbosacral region: Secondary | ICD-10-CM | POA: Diagnosis not present

## 2016-03-29 DIAGNOSIS — M47811 Spondylosis without myelopathy or radiculopathy, occipito-atlanto-axial region: Secondary | ICD-10-CM | POA: Diagnosis not present

## 2016-03-29 DIAGNOSIS — M47815 Spondylosis without myelopathy or radiculopathy, thoracolumbar region: Secondary | ICD-10-CM | POA: Diagnosis not present

## 2016-03-29 DIAGNOSIS — M65312 Trigger thumb, left thumb: Secondary | ICD-10-CM | POA: Diagnosis not present

## 2016-03-29 DIAGNOSIS — M9902 Segmental and somatic dysfunction of thoracic region: Secondary | ICD-10-CM | POA: Diagnosis not present

## 2016-04-05 DIAGNOSIS — F341 Dysthymic disorder: Secondary | ICD-10-CM | POA: Diagnosis not present

## 2016-04-23 DIAGNOSIS — F341 Dysthymic disorder: Secondary | ICD-10-CM | POA: Diagnosis not present

## 2016-05-03 DIAGNOSIS — F341 Dysthymic disorder: Secondary | ICD-10-CM | POA: Diagnosis not present

## 2016-05-03 DIAGNOSIS — Z23 Encounter for immunization: Secondary | ICD-10-CM | POA: Diagnosis not present

## 2016-05-14 DIAGNOSIS — F341 Dysthymic disorder: Secondary | ICD-10-CM | POA: Diagnosis not present

## 2016-05-20 DIAGNOSIS — M799 Soft tissue disorder, unspecified: Secondary | ICD-10-CM | POA: Diagnosis not present

## 2016-05-20 DIAGNOSIS — E7800 Pure hypercholesterolemia, unspecified: Secondary | ICD-10-CM | POA: Diagnosis not present

## 2016-05-20 DIAGNOSIS — I10 Essential (primary) hypertension: Secondary | ICD-10-CM | POA: Diagnosis not present

## 2016-05-20 DIAGNOSIS — E119 Type 2 diabetes mellitus without complications: Secondary | ICD-10-CM | POA: Diagnosis not present

## 2016-06-18 DIAGNOSIS — F341 Dysthymic disorder: Secondary | ICD-10-CM | POA: Diagnosis not present

## 2016-06-21 DIAGNOSIS — J029 Acute pharyngitis, unspecified: Secondary | ICD-10-CM | POA: Diagnosis not present

## 2016-06-21 DIAGNOSIS — J069 Acute upper respiratory infection, unspecified: Secondary | ICD-10-CM | POA: Diagnosis not present

## 2016-07-02 DIAGNOSIS — F341 Dysthymic disorder: Secondary | ICD-10-CM | POA: Diagnosis not present

## 2016-07-23 DIAGNOSIS — F341 Dysthymic disorder: Secondary | ICD-10-CM | POA: Diagnosis not present

## 2016-07-26 DIAGNOSIS — I10 Essential (primary) hypertension: Secondary | ICD-10-CM | POA: Diagnosis not present

## 2016-07-26 DIAGNOSIS — J329 Chronic sinusitis, unspecified: Secondary | ICD-10-CM | POA: Diagnosis not present

## 2016-07-30 DIAGNOSIS — F341 Dysthymic disorder: Secondary | ICD-10-CM | POA: Diagnosis not present

## 2016-08-04 DIAGNOSIS — F341 Dysthymic disorder: Secondary | ICD-10-CM | POA: Diagnosis not present

## 2016-08-18 DIAGNOSIS — F341 Dysthymic disorder: Secondary | ICD-10-CM | POA: Diagnosis not present

## 2016-09-17 DIAGNOSIS — M791 Myalgia: Secondary | ICD-10-CM | POA: Diagnosis not present

## 2016-09-17 DIAGNOSIS — R51 Headache: Secondary | ICD-10-CM | POA: Diagnosis not present

## 2016-09-17 DIAGNOSIS — M9901 Segmental and somatic dysfunction of cervical region: Secondary | ICD-10-CM | POA: Diagnosis not present

## 2016-09-17 DIAGNOSIS — M9902 Segmental and somatic dysfunction of thoracic region: Secondary | ICD-10-CM | POA: Diagnosis not present

## 2016-09-27 DIAGNOSIS — F341 Dysthymic disorder: Secondary | ICD-10-CM | POA: Diagnosis not present

## 2016-10-03 ENCOUNTER — Other Ambulatory Visit: Payer: Self-pay | Admitting: Family Medicine

## 2016-10-03 DIAGNOSIS — Z1231 Encounter for screening mammogram for malignant neoplasm of breast: Secondary | ICD-10-CM

## 2016-10-11 DIAGNOSIS — E119 Type 2 diabetes mellitus without complications: Secondary | ICD-10-CM | POA: Diagnosis not present

## 2016-10-12 DIAGNOSIS — J018 Other acute sinusitis: Secondary | ICD-10-CM | POA: Diagnosis not present

## 2016-10-19 ENCOUNTER — Ambulatory Visit
Admission: RE | Admit: 2016-10-19 | Discharge: 2016-10-19 | Disposition: A | Discharge status: Home / Self Care | Payer: BLUE CROSS/BLUE SHIELD | Source: Ambulatory Visit | Attending: Family Medicine | Admitting: Family Medicine

## 2016-10-19 DIAGNOSIS — F341 Dysthymic disorder: Secondary | ICD-10-CM | POA: Diagnosis not present

## 2016-10-19 DIAGNOSIS — Z1231 Encounter for screening mammogram for malignant neoplasm of breast: Secondary | ICD-10-CM | POA: Diagnosis not present

## 2016-10-20 DIAGNOSIS — J018 Other acute sinusitis: Secondary | ICD-10-CM | POA: Diagnosis not present

## 2016-10-20 DIAGNOSIS — J3089 Other allergic rhinitis: Secondary | ICD-10-CM | POA: Diagnosis not present

## 2016-11-03 DIAGNOSIS — F341 Dysthymic disorder: Secondary | ICD-10-CM | POA: Diagnosis not present

## 2016-11-25 DIAGNOSIS — F341 Dysthymic disorder: Secondary | ICD-10-CM | POA: Diagnosis not present

## 2016-12-24 DIAGNOSIS — F341 Dysthymic disorder: Secondary | ICD-10-CM | POA: Diagnosis not present

## 2017-01-11 DIAGNOSIS — F341 Dysthymic disorder: Secondary | ICD-10-CM | POA: Diagnosis not present

## 2017-02-16 DIAGNOSIS — F341 Dysthymic disorder: Secondary | ICD-10-CM | POA: Diagnosis not present

## 2017-03-24 DIAGNOSIS — F341 Dysthymic disorder: Secondary | ICD-10-CM | POA: Diagnosis not present

## 2017-04-15 DIAGNOSIS — F341 Dysthymic disorder: Secondary | ICD-10-CM | POA: Diagnosis not present

## 2017-04-27 DIAGNOSIS — E119 Type 2 diabetes mellitus without complications: Secondary | ICD-10-CM | POA: Diagnosis not present

## 2017-04-27 DIAGNOSIS — E7800 Pure hypercholesterolemia, unspecified: Secondary | ICD-10-CM | POA: Diagnosis not present

## 2017-04-27 DIAGNOSIS — I10 Essential (primary) hypertension: Secondary | ICD-10-CM | POA: Diagnosis not present

## 2017-04-27 DIAGNOSIS — Z23 Encounter for immunization: Secondary | ICD-10-CM | POA: Diagnosis not present

## 2017-05-24 DIAGNOSIS — F341 Dysthymic disorder: Secondary | ICD-10-CM | POA: Diagnosis not present

## 2017-05-31 DIAGNOSIS — J069 Acute upper respiratory infection, unspecified: Secondary | ICD-10-CM | POA: Diagnosis not present

## 2017-06-26 DIAGNOSIS — M791 Myalgia, unspecified site: Secondary | ICD-10-CM | POA: Diagnosis not present

## 2017-06-26 DIAGNOSIS — M9902 Segmental and somatic dysfunction of thoracic region: Secondary | ICD-10-CM | POA: Diagnosis not present

## 2017-06-26 DIAGNOSIS — M9901 Segmental and somatic dysfunction of cervical region: Secondary | ICD-10-CM | POA: Diagnosis not present

## 2017-06-26 DIAGNOSIS — M7711 Lateral epicondylitis, right elbow: Secondary | ICD-10-CM | POA: Diagnosis not present

## 2017-07-01 DIAGNOSIS — F341 Dysthymic disorder: Secondary | ICD-10-CM | POA: Diagnosis not present

## 2017-07-04 DIAGNOSIS — M791 Myalgia, unspecified site: Secondary | ICD-10-CM | POA: Diagnosis not present

## 2017-07-04 DIAGNOSIS — M9901 Segmental and somatic dysfunction of cervical region: Secondary | ICD-10-CM | POA: Diagnosis not present

## 2017-07-04 DIAGNOSIS — M7711 Lateral epicondylitis, right elbow: Secondary | ICD-10-CM | POA: Diagnosis not present

## 2017-07-04 DIAGNOSIS — M9902 Segmental and somatic dysfunction of thoracic region: Secondary | ICD-10-CM | POA: Diagnosis not present

## 2017-07-05 DIAGNOSIS — E119 Type 2 diabetes mellitus without complications: Secondary | ICD-10-CM | POA: Diagnosis not present

## 2017-07-05 DIAGNOSIS — I10 Essential (primary) hypertension: Secondary | ICD-10-CM | POA: Diagnosis not present

## 2017-07-05 DIAGNOSIS — J329 Chronic sinusitis, unspecified: Secondary | ICD-10-CM | POA: Diagnosis not present

## 2017-07-14 DIAGNOSIS — K219 Gastro-esophageal reflux disease without esophagitis: Secondary | ICD-10-CM | POA: Diagnosis not present

## 2017-07-14 DIAGNOSIS — J029 Acute pharyngitis, unspecified: Secondary | ICD-10-CM | POA: Diagnosis not present

## 2017-07-14 DIAGNOSIS — K140 Glossitis: Secondary | ICD-10-CM | POA: Diagnosis not present

## 2017-07-14 DIAGNOSIS — J320 Chronic maxillary sinusitis: Secondary | ICD-10-CM | POA: Diagnosis not present

## 2017-07-19 DIAGNOSIS — F341 Dysthymic disorder: Secondary | ICD-10-CM | POA: Diagnosis not present

## 2017-08-27 DIAGNOSIS — B349 Viral infection, unspecified: Secondary | ICD-10-CM | POA: Diagnosis not present

## 2017-08-27 DIAGNOSIS — I10 Essential (primary) hypertension: Secondary | ICD-10-CM | POA: Diagnosis not present

## 2017-09-04 DIAGNOSIS — F341 Dysthymic disorder: Secondary | ICD-10-CM | POA: Diagnosis not present

## 2017-10-12 DIAGNOSIS — F341 Dysthymic disorder: Secondary | ICD-10-CM | POA: Diagnosis not present

## 2017-10-16 DIAGNOSIS — E119 Type 2 diabetes mellitus without complications: Secondary | ICD-10-CM | POA: Diagnosis not present

## 2017-10-16 DIAGNOSIS — H4311 Vitreous hemorrhage, right eye: Secondary | ICD-10-CM | POA: Diagnosis not present

## 2017-10-17 DIAGNOSIS — M7711 Lateral epicondylitis, right elbow: Secondary | ICD-10-CM | POA: Diagnosis not present

## 2017-10-17 DIAGNOSIS — M9902 Segmental and somatic dysfunction of thoracic region: Secondary | ICD-10-CM | POA: Diagnosis not present

## 2017-10-17 DIAGNOSIS — M9901 Segmental and somatic dysfunction of cervical region: Secondary | ICD-10-CM | POA: Diagnosis not present

## 2017-10-17 DIAGNOSIS — M791 Myalgia, unspecified site: Secondary | ICD-10-CM | POA: Diagnosis not present

## 2017-10-23 DIAGNOSIS — F341 Dysthymic disorder: Secondary | ICD-10-CM | POA: Diagnosis not present

## 2017-10-24 DIAGNOSIS — M7711 Lateral epicondylitis, right elbow: Secondary | ICD-10-CM | POA: Diagnosis not present

## 2017-10-24 DIAGNOSIS — M9902 Segmental and somatic dysfunction of thoracic region: Secondary | ICD-10-CM | POA: Diagnosis not present

## 2017-10-24 DIAGNOSIS — M791 Myalgia, unspecified site: Secondary | ICD-10-CM | POA: Diagnosis not present

## 2017-10-24 DIAGNOSIS — M9901 Segmental and somatic dysfunction of cervical region: Secondary | ICD-10-CM | POA: Diagnosis not present

## 2017-10-31 DIAGNOSIS — M9902 Segmental and somatic dysfunction of thoracic region: Secondary | ICD-10-CM | POA: Diagnosis not present

## 2017-10-31 DIAGNOSIS — M7711 Lateral epicondylitis, right elbow: Secondary | ICD-10-CM | POA: Diagnosis not present

## 2017-10-31 DIAGNOSIS — M791 Myalgia, unspecified site: Secondary | ICD-10-CM | POA: Diagnosis not present

## 2017-10-31 DIAGNOSIS — M9901 Segmental and somatic dysfunction of cervical region: Secondary | ICD-10-CM | POA: Diagnosis not present

## 2017-11-08 DIAGNOSIS — F341 Dysthymic disorder: Secondary | ICD-10-CM | POA: Diagnosis not present

## 2017-11-09 DIAGNOSIS — M7711 Lateral epicondylitis, right elbow: Secondary | ICD-10-CM | POA: Diagnosis not present

## 2017-11-09 DIAGNOSIS — M9902 Segmental and somatic dysfunction of thoracic region: Secondary | ICD-10-CM | POA: Diagnosis not present

## 2017-11-09 DIAGNOSIS — M791 Myalgia, unspecified site: Secondary | ICD-10-CM | POA: Diagnosis not present

## 2017-11-09 DIAGNOSIS — M9901 Segmental and somatic dysfunction of cervical region: Secondary | ICD-10-CM | POA: Diagnosis not present

## 2017-11-24 DIAGNOSIS — F341 Dysthymic disorder: Secondary | ICD-10-CM | POA: Diagnosis not present

## 2018-01-01 DIAGNOSIS — F341 Dysthymic disorder: Secondary | ICD-10-CM | POA: Diagnosis not present

## 2018-01-08 DIAGNOSIS — F341 Dysthymic disorder: Secondary | ICD-10-CM | POA: Diagnosis not present

## 2018-01-22 DIAGNOSIS — M7711 Lateral epicondylitis, right elbow: Secondary | ICD-10-CM | POA: Diagnosis not present

## 2018-01-22 DIAGNOSIS — M9901 Segmental and somatic dysfunction of cervical region: Secondary | ICD-10-CM | POA: Diagnosis not present

## 2018-01-22 DIAGNOSIS — M791 Myalgia, unspecified site: Secondary | ICD-10-CM | POA: Diagnosis not present

## 2018-01-22 DIAGNOSIS — M9902 Segmental and somatic dysfunction of thoracic region: Secondary | ICD-10-CM | POA: Diagnosis not present

## 2018-01-24 DIAGNOSIS — M9901 Segmental and somatic dysfunction of cervical region: Secondary | ICD-10-CM | POA: Diagnosis not present

## 2018-01-24 DIAGNOSIS — M791 Myalgia, unspecified site: Secondary | ICD-10-CM | POA: Diagnosis not present

## 2018-01-24 DIAGNOSIS — M9902 Segmental and somatic dysfunction of thoracic region: Secondary | ICD-10-CM | POA: Diagnosis not present

## 2018-01-24 DIAGNOSIS — M7711 Lateral epicondylitis, right elbow: Secondary | ICD-10-CM | POA: Diagnosis not present

## 2018-02-01 DIAGNOSIS — M7711 Lateral epicondylitis, right elbow: Secondary | ICD-10-CM | POA: Diagnosis not present

## 2018-02-01 DIAGNOSIS — M9902 Segmental and somatic dysfunction of thoracic region: Secondary | ICD-10-CM | POA: Diagnosis not present

## 2018-02-01 DIAGNOSIS — M791 Myalgia, unspecified site: Secondary | ICD-10-CM | POA: Diagnosis not present

## 2018-02-01 DIAGNOSIS — M9901 Segmental and somatic dysfunction of cervical region: Secondary | ICD-10-CM | POA: Diagnosis not present

## 2018-02-07 DIAGNOSIS — I10 Essential (primary) hypertension: Secondary | ICD-10-CM | POA: Diagnosis not present

## 2018-02-07 DIAGNOSIS — E559 Vitamin D deficiency, unspecified: Secondary | ICD-10-CM | POA: Diagnosis not present

## 2018-02-07 DIAGNOSIS — E7800 Pure hypercholesterolemia, unspecified: Secondary | ICD-10-CM | POA: Diagnosis not present

## 2018-02-07 DIAGNOSIS — F341 Dysthymic disorder: Secondary | ICD-10-CM | POA: Diagnosis not present

## 2018-02-07 DIAGNOSIS — E119 Type 2 diabetes mellitus without complications: Secondary | ICD-10-CM | POA: Diagnosis not present

## 2018-02-07 DIAGNOSIS — F330 Major depressive disorder, recurrent, mild: Secondary | ICD-10-CM | POA: Diagnosis not present

## 2018-02-21 DIAGNOSIS — M7711 Lateral epicondylitis, right elbow: Secondary | ICD-10-CM | POA: Diagnosis not present

## 2018-02-21 DIAGNOSIS — M791 Myalgia, unspecified site: Secondary | ICD-10-CM | POA: Diagnosis not present

## 2018-02-21 DIAGNOSIS — M9901 Segmental and somatic dysfunction of cervical region: Secondary | ICD-10-CM | POA: Diagnosis not present

## 2018-02-21 DIAGNOSIS — M9902 Segmental and somatic dysfunction of thoracic region: Secondary | ICD-10-CM | POA: Diagnosis not present

## 2018-02-26 DIAGNOSIS — M7711 Lateral epicondylitis, right elbow: Secondary | ICD-10-CM | POA: Diagnosis not present

## 2018-02-26 DIAGNOSIS — M791 Myalgia, unspecified site: Secondary | ICD-10-CM | POA: Diagnosis not present

## 2018-02-26 DIAGNOSIS — M9902 Segmental and somatic dysfunction of thoracic region: Secondary | ICD-10-CM | POA: Diagnosis not present

## 2018-02-26 DIAGNOSIS — M9901 Segmental and somatic dysfunction of cervical region: Secondary | ICD-10-CM | POA: Diagnosis not present

## 2018-03-06 DIAGNOSIS — M791 Myalgia, unspecified site: Secondary | ICD-10-CM | POA: Diagnosis not present

## 2018-03-06 DIAGNOSIS — M9901 Segmental and somatic dysfunction of cervical region: Secondary | ICD-10-CM | POA: Diagnosis not present

## 2018-03-06 DIAGNOSIS — M9902 Segmental and somatic dysfunction of thoracic region: Secondary | ICD-10-CM | POA: Diagnosis not present

## 2018-03-06 DIAGNOSIS — M7711 Lateral epicondylitis, right elbow: Secondary | ICD-10-CM | POA: Diagnosis not present

## 2018-03-12 DIAGNOSIS — F341 Dysthymic disorder: Secondary | ICD-10-CM | POA: Diagnosis not present

## 2018-03-13 DIAGNOSIS — M7711 Lateral epicondylitis, right elbow: Secondary | ICD-10-CM | POA: Diagnosis not present

## 2018-03-13 DIAGNOSIS — M9901 Segmental and somatic dysfunction of cervical region: Secondary | ICD-10-CM | POA: Diagnosis not present

## 2018-03-13 DIAGNOSIS — M9902 Segmental and somatic dysfunction of thoracic region: Secondary | ICD-10-CM | POA: Diagnosis not present

## 2018-03-13 DIAGNOSIS — M791 Myalgia, unspecified site: Secondary | ICD-10-CM | POA: Diagnosis not present

## 2018-03-21 DIAGNOSIS — F341 Dysthymic disorder: Secondary | ICD-10-CM | POA: Diagnosis not present

## 2018-04-09 DIAGNOSIS — F341 Dysthymic disorder: Secondary | ICD-10-CM | POA: Diagnosis not present

## 2018-04-25 DIAGNOSIS — F341 Dysthymic disorder: Secondary | ICD-10-CM | POA: Diagnosis not present

## 2018-05-16 DIAGNOSIS — F341 Dysthymic disorder: Secondary | ICD-10-CM | POA: Diagnosis not present

## 2018-05-28 DIAGNOSIS — J019 Acute sinusitis, unspecified: Secondary | ICD-10-CM | POA: Diagnosis not present

## 2018-06-14 DIAGNOSIS — T887XXA Unspecified adverse effect of drug or medicament, initial encounter: Secondary | ICD-10-CM | POA: Diagnosis not present

## 2018-06-14 DIAGNOSIS — J0100 Acute maxillary sinusitis, unspecified: Secondary | ICD-10-CM | POA: Diagnosis not present

## 2018-06-14 IMAGING — MG DIGITAL SCREENING BILATERAL MAMMOGRAM WITH CAD
4 series · 4 of 4 positions shown · non-contrast
Comparison: Previous exam(s).

CLINICAL DATA: Screening.

EXAM:
DIGITAL SCREENING BILATERAL MAMMOGRAM WITH CAD

[R MLO]
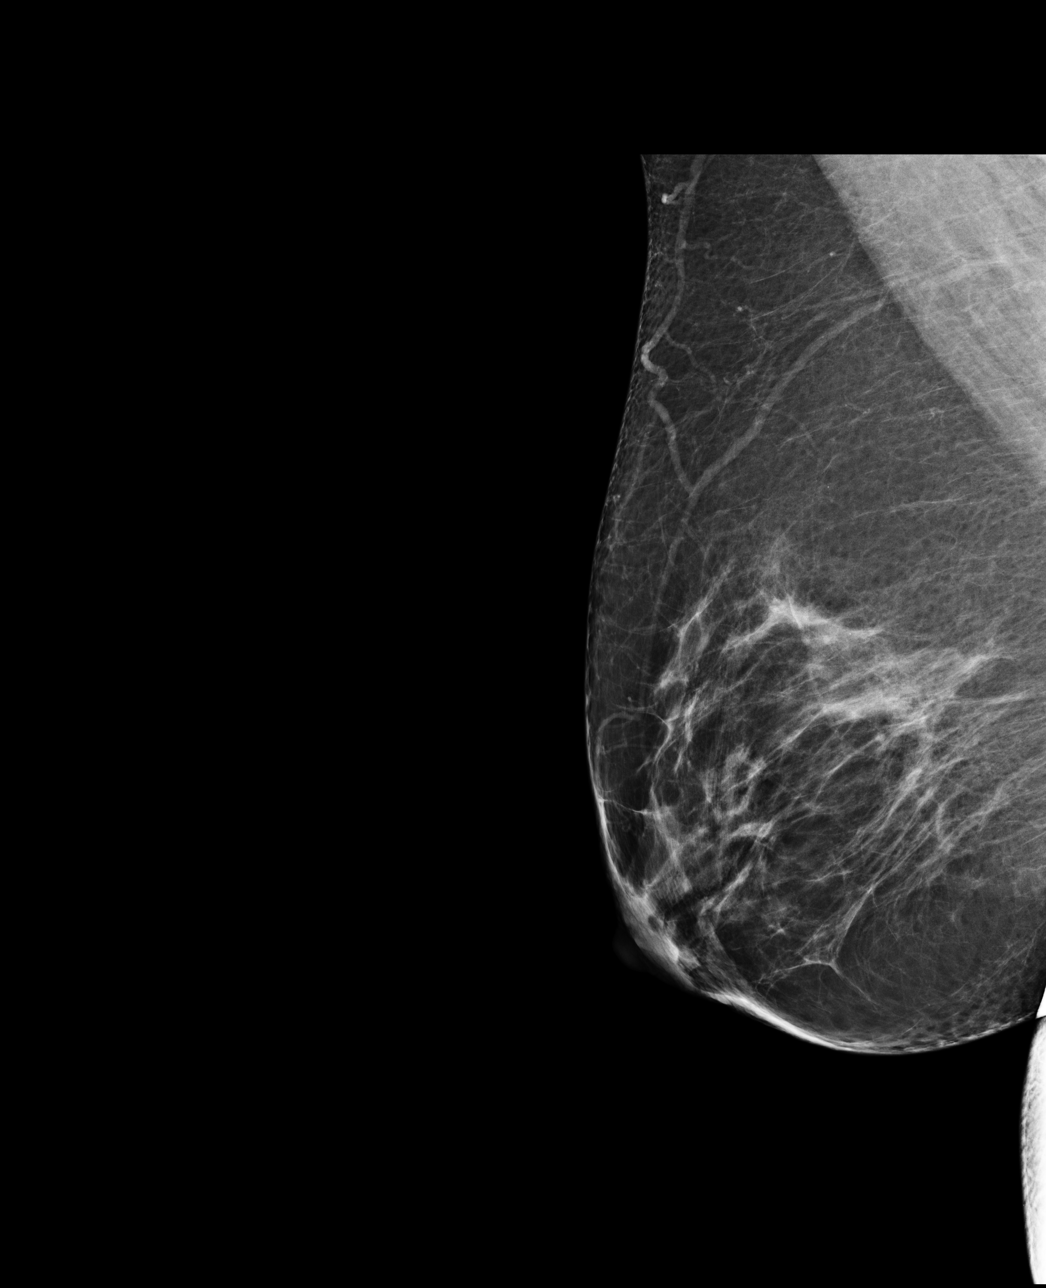

[R CC]
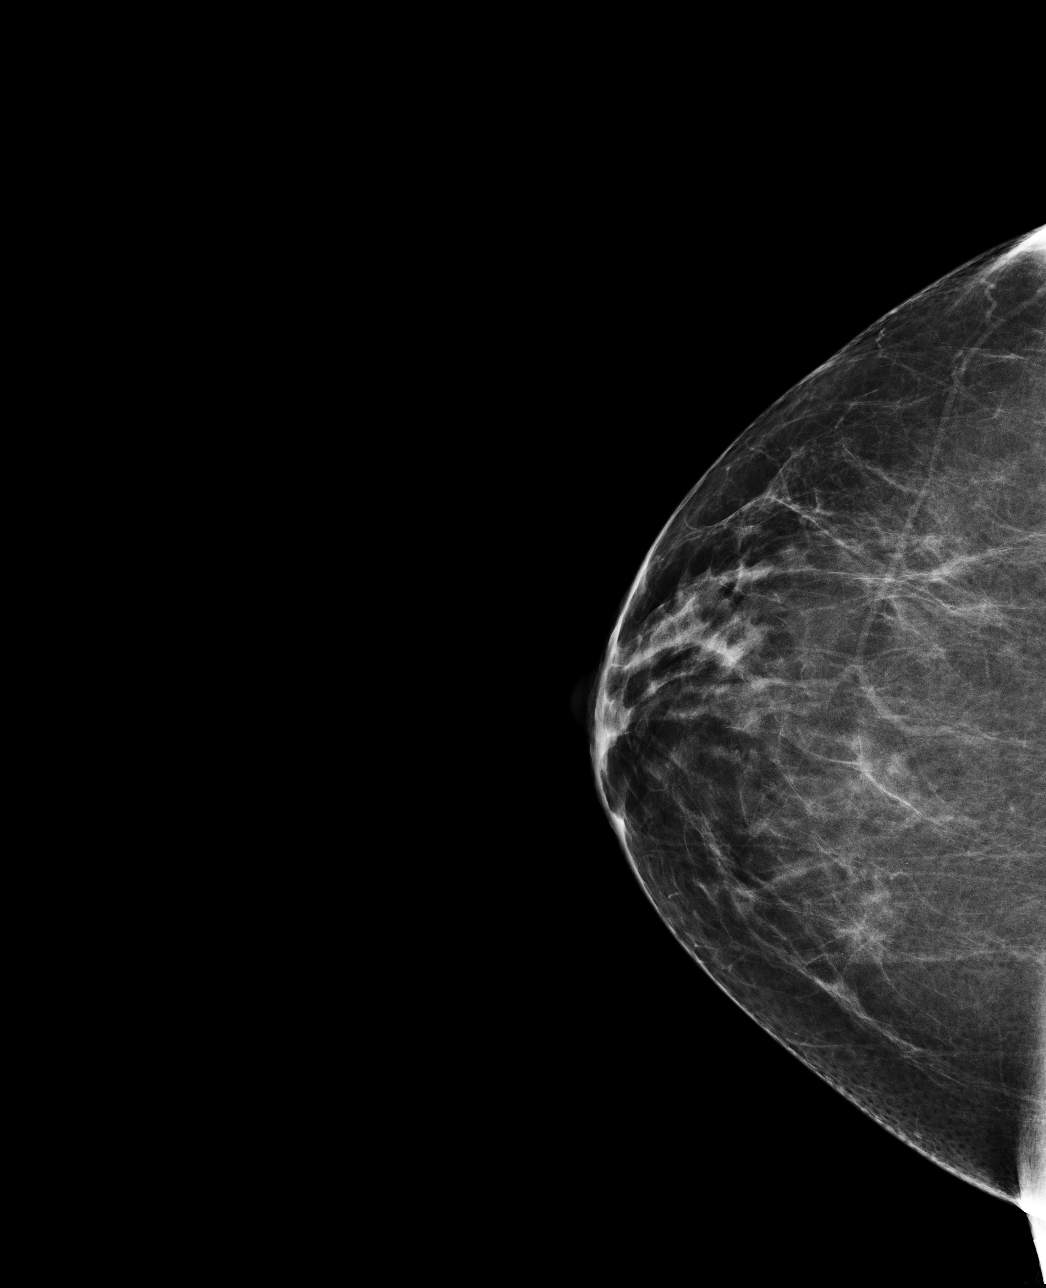

[L CC]
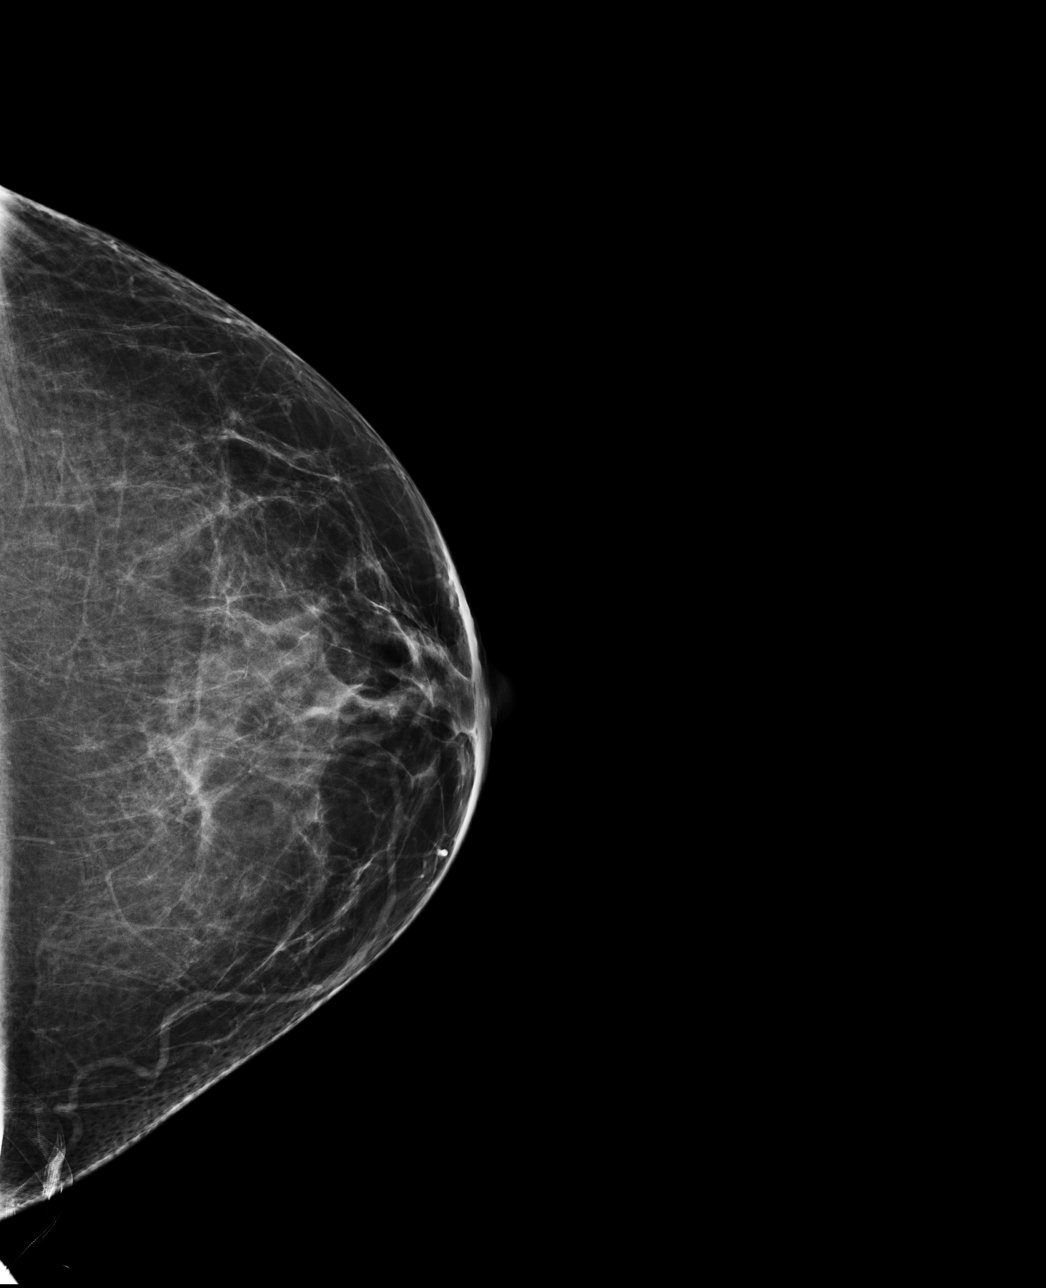

[L MLO]
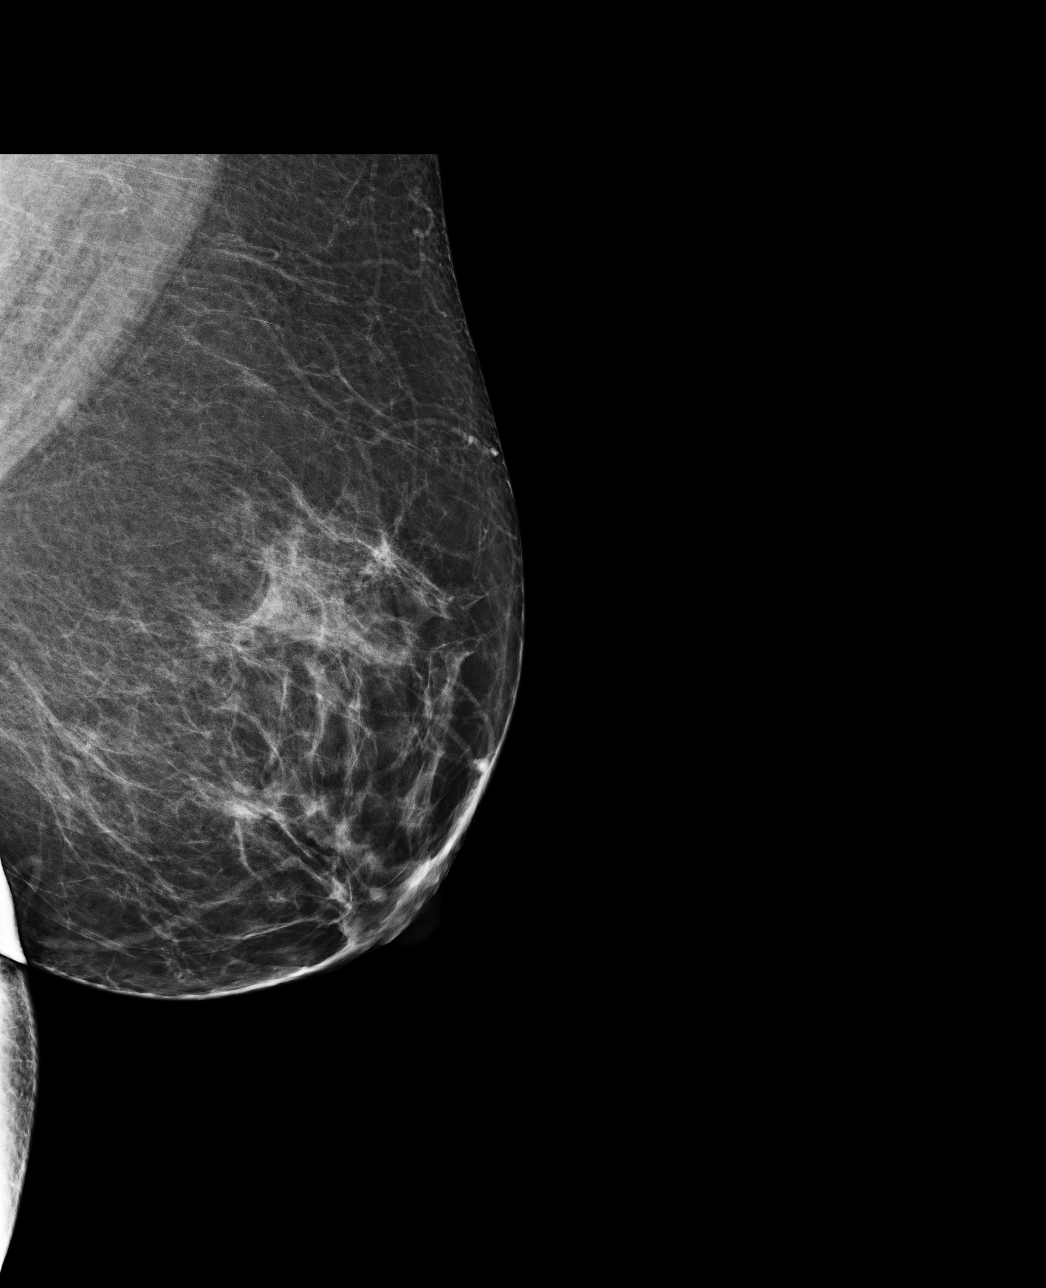

[4 of 4 positions shown; findings below may reference images not displayed]

ACR Breast Density Category b: There are scattered areas of
fibroglandular density.
FINDINGS: There are no findings suspicious for malignancy. Images were
processed with CAD.
IMPRESSION: No mammographic evidence of malignancy. A result letter of this
screening mammogram will be mailed directly to the patient.

RECOMMENDATION:
Screening mammogram in one year. (Code:AS-G-LCT)

BI-RADS CATEGORY  1: Negative.

## 2018-06-20 ENCOUNTER — Other Ambulatory Visit: Payer: Self-pay | Admitting: Family Medicine

## 2018-06-20 DIAGNOSIS — Z1231 Encounter for screening mammogram for malignant neoplasm of breast: Secondary | ICD-10-CM

## 2018-06-25 DIAGNOSIS — F341 Dysthymic disorder: Secondary | ICD-10-CM | POA: Diagnosis not present

## 2018-07-13 DIAGNOSIS — F341 Dysthymic disorder: Secondary | ICD-10-CM | POA: Diagnosis not present

## 2018-07-17 ENCOUNTER — Ambulatory Visit
Admission: RE | Admit: 2018-07-17 | Discharge: 2018-07-17 | Disposition: A | Discharge status: Home / Self Care | Payer: BLUE CROSS/BLUE SHIELD | Source: Ambulatory Visit | Attending: Family Medicine | Admitting: Family Medicine

## 2018-07-17 DIAGNOSIS — Z1231 Encounter for screening mammogram for malignant neoplasm of breast: Secondary | ICD-10-CM | POA: Diagnosis not present

## 2018-07-17 DIAGNOSIS — M7711 Lateral epicondylitis, right elbow: Secondary | ICD-10-CM | POA: Diagnosis not present

## 2018-07-17 DIAGNOSIS — M791 Myalgia, unspecified site: Secondary | ICD-10-CM | POA: Diagnosis not present

## 2018-07-17 DIAGNOSIS — M9902 Segmental and somatic dysfunction of thoracic region: Secondary | ICD-10-CM | POA: Diagnosis not present

## 2018-07-17 DIAGNOSIS — M9901 Segmental and somatic dysfunction of cervical region: Secondary | ICD-10-CM | POA: Diagnosis not present

## 2018-07-27 DIAGNOSIS — F341 Dysthymic disorder: Secondary | ICD-10-CM | POA: Diagnosis not present

## 2018-08-13 DIAGNOSIS — F341 Dysthymic disorder: Secondary | ICD-10-CM | POA: Diagnosis not present

## 2018-12-25 DIAGNOSIS — F341 Dysthymic disorder: Secondary | ICD-10-CM | POA: Diagnosis not present

## 2019-01-02 DIAGNOSIS — K5792 Diverticulitis of intestine, part unspecified, without perforation or abscess without bleeding: Secondary | ICD-10-CM | POA: Diagnosis not present

## 2019-02-04 DIAGNOSIS — I10 Essential (primary) hypertension: Secondary | ICD-10-CM | POA: Diagnosis not present

## 2019-02-04 DIAGNOSIS — Z23 Encounter for immunization: Secondary | ICD-10-CM | POA: Diagnosis not present

## 2019-02-04 DIAGNOSIS — E1169 Type 2 diabetes mellitus with other specified complication: Secondary | ICD-10-CM | POA: Diagnosis not present

## 2019-02-04 DIAGNOSIS — E7800 Pure hypercholesterolemia, unspecified: Secondary | ICD-10-CM | POA: Diagnosis not present

## 2019-08-11 ENCOUNTER — Encounter: Payer: Self-pay | Admitting: Interventional Cardiology

## 2019-08-27 ENCOUNTER — Telehealth: Payer: Self-pay

## 2019-08-27 NOTE — Telephone Encounter (Signed)
NOTES ON FILE FROM [facility] [phone] SENT REFERRAL TO SCHEDULING 

## 2019-09-26 ENCOUNTER — Encounter: Payer: Self-pay | Admitting: Interventional Cardiology

## 2019-09-26 ENCOUNTER — Other Ambulatory Visit: Payer: Self-pay

## 2019-09-26 ENCOUNTER — Ambulatory Visit (INDEPENDENT_AMBULATORY_CARE_PROVIDER_SITE_OTHER): Payer: 59 | Admitting: Interventional Cardiology

## 2019-09-26 VITALS — BP 126/80 | HR 90 | Ht 65.0 in | Wt 214.8 lb

## 2019-09-26 DIAGNOSIS — R0602 Shortness of breath: Secondary | ICD-10-CM

## 2019-09-26 DIAGNOSIS — R079 Chest pain, unspecified: Secondary | ICD-10-CM

## 2019-09-26 DIAGNOSIS — I517 Cardiomegaly: Secondary | ICD-10-CM | POA: Diagnosis not present

## 2019-09-26 MED ORDER — METOPROLOL TARTRATE 100 MG PO TABS: ORAL_TABLET | ORAL | 0 refills | Status: DC

## 2019-09-26 NOTE — Progress Notes (Signed)
Cardiology Office Note   Date:  09/26/2019   ID:  Brittany, Campos 09/11/1955, MRN [id number]  PCP:  Clayborn Heron, MD    No chief complaint on file.  Chest pain  Wt Readings from Last 3 Encounters:  09/26/19 214 lb 12.8 oz (97.4 kg)  12/06/11 195 lb 12.8 oz (88.8 kg)       History of Present Illness: Brittany Campos is a 64 y.o. female who is being seen today for the evaluation of chest pressure at the request of Rankins, Brittany Dance, MD.  Since February, she has had chest pressure.  Her husband tested positive for COVID at that time.  SHe was negative.  CP worsened by walking.    She was concerned because she was told that she had an enlarged heart.  She has some DOE.  No orthopnea.  Denies : Dizziness. Leg edema. Nitroglycerin use. Orthopnea. Palpitations. Paroxysmal nocturnal dyspnea.  Syncope.   No smoking.  Mother had HTN.  No early CAD in the family.     Past Medical History:  Diagnosis Date  . Allergic sinusitis   . Arthritis   . BPPV (benign paroxysmal positional vertigo)   . Cardiomegaly   . Depression   . Diabetes mellitus without complication (HCC)   . Diverticulitis   . Esophagitis   . GERD (gastroesophageal reflux disease)   . Hypercholesterolemia   . Hypertension   . Left breast mass   . Lumbar herniated disc   . OSA (obstructive sleep apnea)   . Tendinitis of left rotator cuff   . Tennis elbow   . Tongue ulcer   . Type 2 diabetes mellitus with other specified complication (HCC)   . Vitamin D deficiency     Past Surgical History:  Procedure Laterality Date  . CARPAL TUNNEL RELEASE    . CHOLECYSTECTOMY       Current Outpatient Medications  Medication Sig Dispense Refill  . Ascorbic Acid (VITAMIN C) 1000 MG tablet Take 1,000 mg by mouth daily.    . ciprofloxacin (CIPRO) 500 MG tablet Take 1 tablet (500 mg total) by mouth every 12 (twelve) hours. 14 tablet 0  . FLUoxetine (PROZAC) 20 MG capsule Take 20 mg by mouth daily.    .  fluticasone (FLONASE) 50 MCG/ACT nasal spray Place 2 sprays into the nose as needed. For stuffiness    . glimepiride (AMARYL) 4 MG tablet Take 4 mg by mouth 2 (two) times daily.    Marland Kitchen ibuprofen (ADVIL,MOTRIN) 200 MG tablet Take 400-600 mg by mouth 2 (two) times daily as needed. For pain    . lisinopril-hydrochlorothiazide (PRINZIDE,ZESTORETIC) 20-12.5 MG per tablet Take 2 tablets by mouth daily.    . meloxicam (MOBIC) 15 MG tablet Take 15 mg by mouth daily.    . Multiple Vitamins-Minerals (MULTIVITAMIN WITH MINERALS) tablet Take 1 tablet by mouth daily.    Marland Kitchen omeprazole (PRILOSEC OTC) 20 MG tablet Take 20 mg by mouth 2 (two) times daily as needed. For acid reflux    . potassium chloride (K-DUR) 10 MEQ tablet Take 1 tablet (10 mEq total) by mouth 2 (two) times daily. 30 tablet 0  . pseudoephedrine-guaifenesin (MUCINEX D) 60-600 MG per tablet Take 1 tablet by mouth 2 (two) times daily as needed. For congestion    . Pyridoxine HCl (VITAMIN B-6 PO) Take 2 tablets by mouth daily.    . verapamil (CALAN) 80 MG tablet Take 80 mg by mouth daily.     No current  facility-administered medications for this visit.    Allergies:   Ampicillin    Social History:  The patient  reports that she has never smoked. She has never used smokeless tobacco. She reports current alcohol use. She reports that she does not use drugs.   Family History:  The patient's family history includes Cancer - Colon in her mother; Cancer - Prostate in her father; Colon cancer in her maternal aunt; Diabetes in her father; Healthy in her brother, daughter, and son; High Cholesterol in her father; Hypertension in her father and mother; Stroke in her paternal grandmother.    ROS:  Please see the history of present illness.   Otherwise, review of systems are positive for chest discomfort.   All other systems are reviewed and negative.    PHYSICAL EXAM: VS:  BP 126/80   Pulse 90   Ht 5\' 5"  (1.651 m)   Wt 214 lb 12.8 oz (97.4 kg)   SpO2  97%   BMI 35.74 kg/m  , BMI Body mass index is 35.74 kg/m. GEN: Well nourished, well developed, in no acute distress  HEENT: normal  Neck: no JVD, carotid bruits, or masses Cardiac: RRR; no murmurs, rubs, or gallops,no edema  Respiratory:  clear to auscultation bilaterally, normal work of breathing GI: soft, nontender, nondistended, + BS MS: no deformity or atrophy  Skin: warm and dry, no rash Neuro:  Strength and sensation are intact Psych: euthymic mood, full affect   EKG:   The ekg ordered today demonstrates NSR, .   Recent Labs: No results found for requested labs within last 8760 hours.   Lipid Panel No results found for: CHOL, TRIG, HDL, CHOLHDL, VLDL, LDLCALC, LDLDIRECT   Other studies Reviewed: Additional studies/ records that were reviewed today with results demonstrating: PMD labs reviewed.   ASSESSMENT AND PLAN:  1. Chest pain: Concerning for angina since it is exertional.   We discussed cath vs. CTA.  WIl plan for CTA.  Metoprolol 100 mg morning of test.    2. DM: A1C reviewed, above target.   3. HTN: The current medical regimen is effective;  continue present plan and medications. 4. DOE: Check echo. This will help eval cardiomegaly.    Current medicines are reviewed at length with the patient today.  The patient concerns regarding her medicines were addressed.  The following changes have been made:  No change  Labs/ tests ordered today include:  No orders of the defined types were placed in this encounter.   Recommend 150 minutes/week of aerobic exercise Low fat, low carb, high fiber diet recommended  Disposition:   FU based on results   Signed, Larae Grooms, MD  09/26/2019 2:33 PM    [facility] [place], [place], [place]  [address] Phone: [phone]; Fax: [phone]

## 2019-09-26 NOTE — Patient Instructions (Addendum)
Medication Instructions:  Your physician recommends that you continue on your current medications as directed. Please refer to the Current Medication list given to you today.  *If you need a refill on your cardiac medications before your next appointment, please call your pharmacy*   Lab Work: None ordered  If you have labs (blood work) drawn today and your tests are completely normal, you will receive your results only by: Marland Kitchen MyChart Message (if you have MyChart) OR . A paper copy in the mail If you have any lab test that is abnormal or we need to change your treatment, we will call you to review the results.   Testing/Procedures: Your physician has requested that you have an echocardiogram. Echocardiography is a painless test that uses sound waves to create images of your heart. It provides your doctor with information about the size and shape of your heart and how well your heart's chambers and valves are working. This procedure takes approximately one hour. There are no restrictions for this procedure.  Your physician has requested that you have cardiac CT. Cardiac computed tomography (CT) is a painless test that uses an x-ray machine to take clear, detailed pictures of your heart. For further information please visit [email or web address][facility]tn. Please follow instruction sheet as given.   Follow-Up: Based on test results   Other Instructions Your cardiac CT will be scheduled at one of the below locations:   [facility] [address] [place], [place] [address] [phone]  [facility] [address] [place], [place] [address] [phone]  If scheduled at [facility], please arrive at the [facility] main entrance of [facility] 30 minutes prior to test start time. Proceed to the [facility] Radiology Department (first floor) to check-in and test prep.  If scheduled at [facility], please arrive 15 mins early for check-in and test prep.  Please follow these instructions carefully (unless otherwise directed):   On the Night Before the Test: . Be sure to Drink plenty of water. . Do not consume any caffeinated/decaffeinated beverages or chocolate 12 hours prior to your test. . Do not take any antihistamines 12 hours prior to your test.  On the Day of the Test: . Drink plenty of water. Do not drink any water within one hour of the test. . Do not eat any food 4 hours prior to the test. . You may take your regular medications prior to the test.  . Take metoprolol (Lopressor) 100 MG two hours prior to test. . HOLD lisinopril-Hydrochlorothiazide morning of the test. . HOLD glimepiride the morning of the test. . FEMALES- please wear underwire-free bra if available      After the Test: . Drink plenty of water. . After receiving IV contrast, you may experience a mild flushed feeling. This is normal. . On occasion, you may experience a mild rash up to 24 hours after the test. This is not dangerous. If this occurs, you can take Benadryl 25 mg and increase your fluid intake. . If you experience trouble breathing, this can be serious. If it is severe call [phone] IMMEDIATELY. If it is mild, please call our office.  Once we have confirmed authorization from your insurance company, we will call you to set up a date and time for your test.   For non-scheduling related questions, please contact the cardiac imaging nurse navigator should you have any questions/concerns: Marchia Bond, RN Navigator Cardiac Imaging  [facility] [phone] office  For scheduling needs, including cancellations and rescheduling, please call [phone].

## 2019-10-16 ENCOUNTER — Ambulatory Visit (HOSPITAL_COMMUNITY): Payer: 59 | Attending: Cardiology

## 2019-10-16 ENCOUNTER — Other Ambulatory Visit: Payer: Self-pay

## 2019-10-16 DIAGNOSIS — I517 Cardiomegaly: Secondary | ICD-10-CM | POA: Diagnosis not present

## 2019-10-16 DIAGNOSIS — R0602 Shortness of breath: Secondary | ICD-10-CM | POA: Diagnosis present

## 2019-10-17 ENCOUNTER — Telehealth: Payer: Self-pay | Admitting: Interventional Cardiology

## 2019-10-17 NOTE — Telephone Encounter (Signed)
Patient returned call for her test results.  

## 2019-10-17 NOTE — Telephone Encounter (Signed)
PT AWARE OF ECHO RESULTS./CY 

## 2019-11-20 ENCOUNTER — Telehealth: Payer: Self-pay

## 2019-11-20 DIAGNOSIS — R079 Chest pain, unspecified: Secondary | ICD-10-CM

## 2019-11-20 NOTE — Telephone Encounter (Signed)
Left message to call back  

## 2019-11-20 NOTE — Telephone Encounter (Signed)
-----   Message from Corky Crafts, MD sent at 11/20/2019 12:01 AM EDT ----- Regarding: RE: cardiac cta denial OK to schedule exercise nuclear stress test. JV ----- Message ----- From: Carmelina Paddock Sent: 11/18/2019   9:49 AM EDT To: Corky Crafts, MD, Bertram Gala Subject: cardiac cta denial                             Good morning Dr. Eldridge Dace,  Insurance is denying cardiac cta.  They are suggesting a stress test.    I did submit an appeal and have not had an answer back yet with determination, however I wanted to let you know since we have been trying to get this auth for some time now incase you wanted to order a different test for her.  Thank you,  Morrie Sheldon

## 2019-11-20 NOTE — Telephone Encounter (Signed)
Called and made patient aware that insurance did not approve Cardiac CT and Dr. Eldridge Dace recommends an exercise nuclear stress test. Patient agrees to proceed. Reviewed instructions. Patient aware that she will be contacted to schedule.

## 2019-11-25 ENCOUNTER — Telehealth (HOSPITAL_COMMUNITY): Payer: Self-pay | Admitting: *Deleted

## 2019-11-25 NOTE — Telephone Encounter (Signed)
Left message on voicemail per DPR in reference to upcoming appointment scheduled on 11/27/19 with detailed instructions given per Myocardial Perfusion Study Information Sheet for the test. LM to arrive 15 minutes early, and that it is imperative to arrive on time for appointment to keep from having the test rescheduled. If you need to cancel or reschedule your appointment, please call the office within 24 hours of your appointment. Failure to do so may result in a cancellation of your appointment, and a $50 no show fee. Phone number given for call back for any questions.

## 2019-11-26 ENCOUNTER — Encounter (HOSPITAL_COMMUNITY): Payer: 59

## 2019-11-27 ENCOUNTER — Other Ambulatory Visit: Payer: Self-pay

## 2019-11-27 ENCOUNTER — Ambulatory Visit (HOSPITAL_COMMUNITY): Payer: 59 | Attending: Cardiology

## 2019-11-27 DIAGNOSIS — R079 Chest pain, unspecified: Secondary | ICD-10-CM | POA: Diagnosis present

## 2019-11-27 LAB — MYOCARDIAL PERFUSION IMAGING
Estimated workload: 7 METS
Exercise duration (min): 6 min
Exercise duration (sec): 15 s
LV dias vol: 67 mL (ref 46–106)
LV sys vol: 30 mL
MPHR: 158 {beats}/min
Peak HR: 151 {beats}/min
Percent HR: 95 %
Rest HR: 95 {beats}/min
SDS: 1
SRS: 1
SSS: 2
TID: 1.04

## 2019-11-27 MED ORDER — TECHNETIUM TC 99M TETROFOSMIN IV KIT
31.3000 | PACK | Freq: Once | INTRAVENOUS | Status: AC | PRN
  Administered 2019-11-27: 31.3 via INTRAVENOUS
  Filled 2019-11-27: qty 32

## 2019-11-27 MED ORDER — TECHNETIUM TC 99M TETROFOSMIN IV KIT
10.1000 | PACK | Freq: Once | INTRAVENOUS | Status: AC | PRN
  Administered 2019-11-27: 10.1 via INTRAVENOUS
  Filled 2019-11-27: qty 11

## 2020-03-11 IMAGING — MG DIGITAL SCREENING BILATERAL MAMMOGRAM WITH CAD
4 series · 4 of 4 positions shown · non-contrast
Comparison: Previous exam(s).

CLINICAL DATA: Screening.

EXAM:
DIGITAL SCREENING BILATERAL MAMMOGRAM WITH CAD

[L CC]
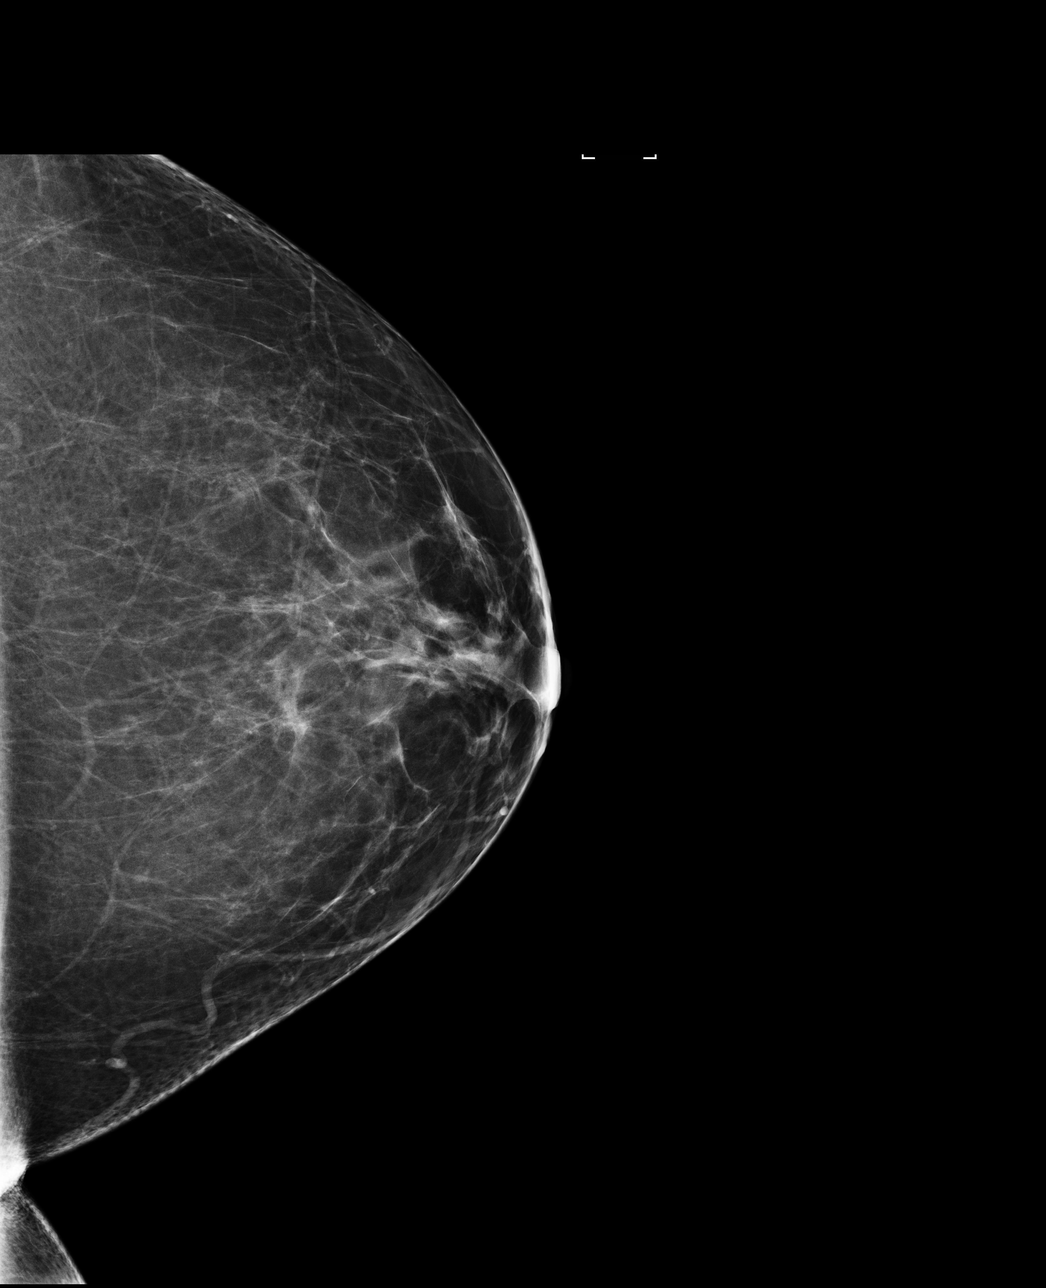

[R CC]
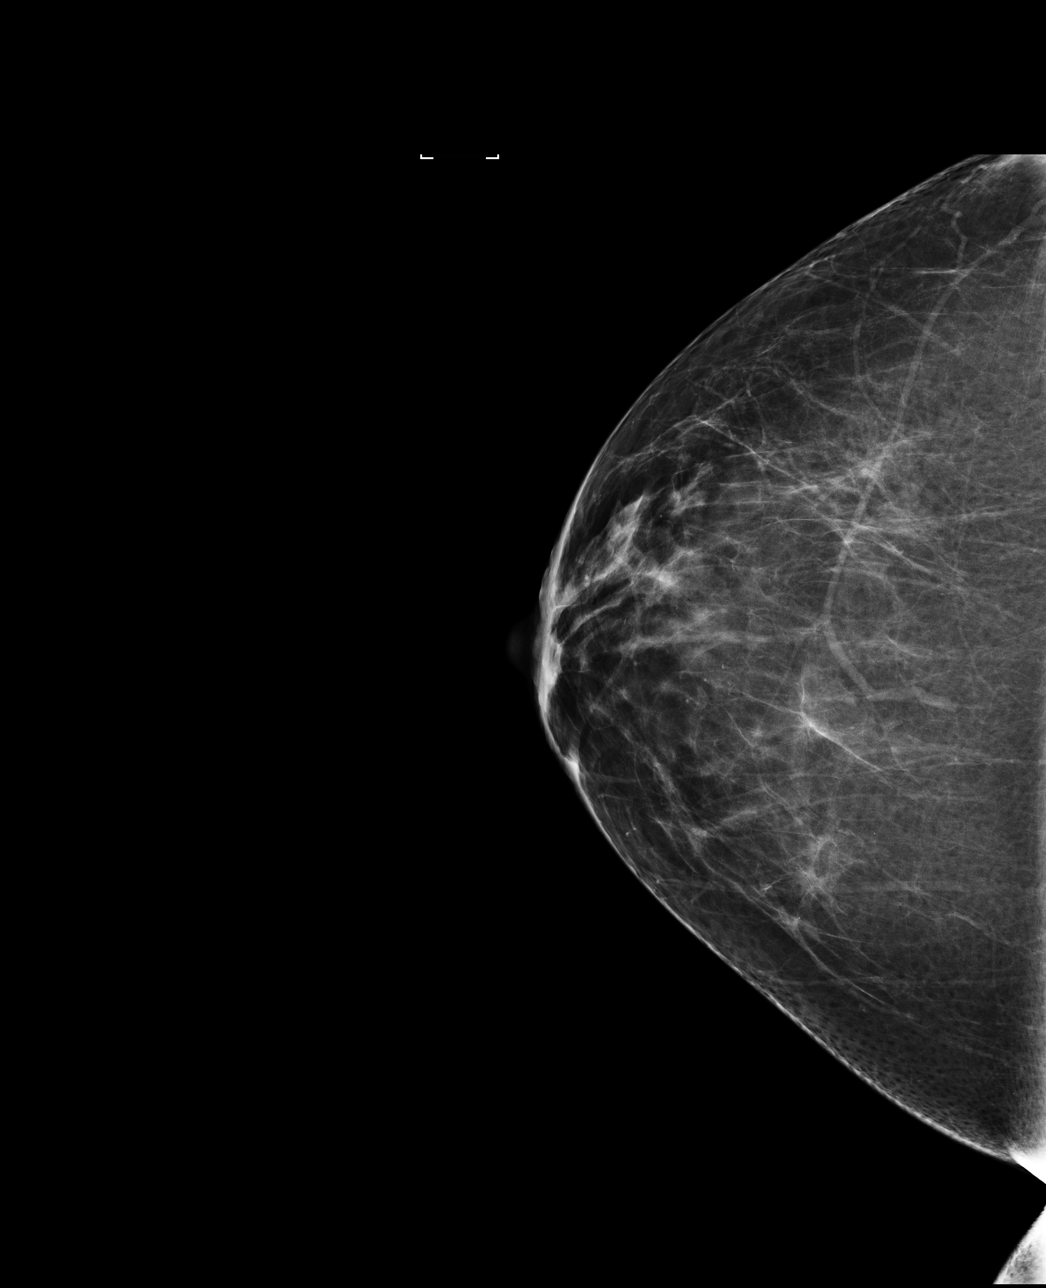

[R MLO]
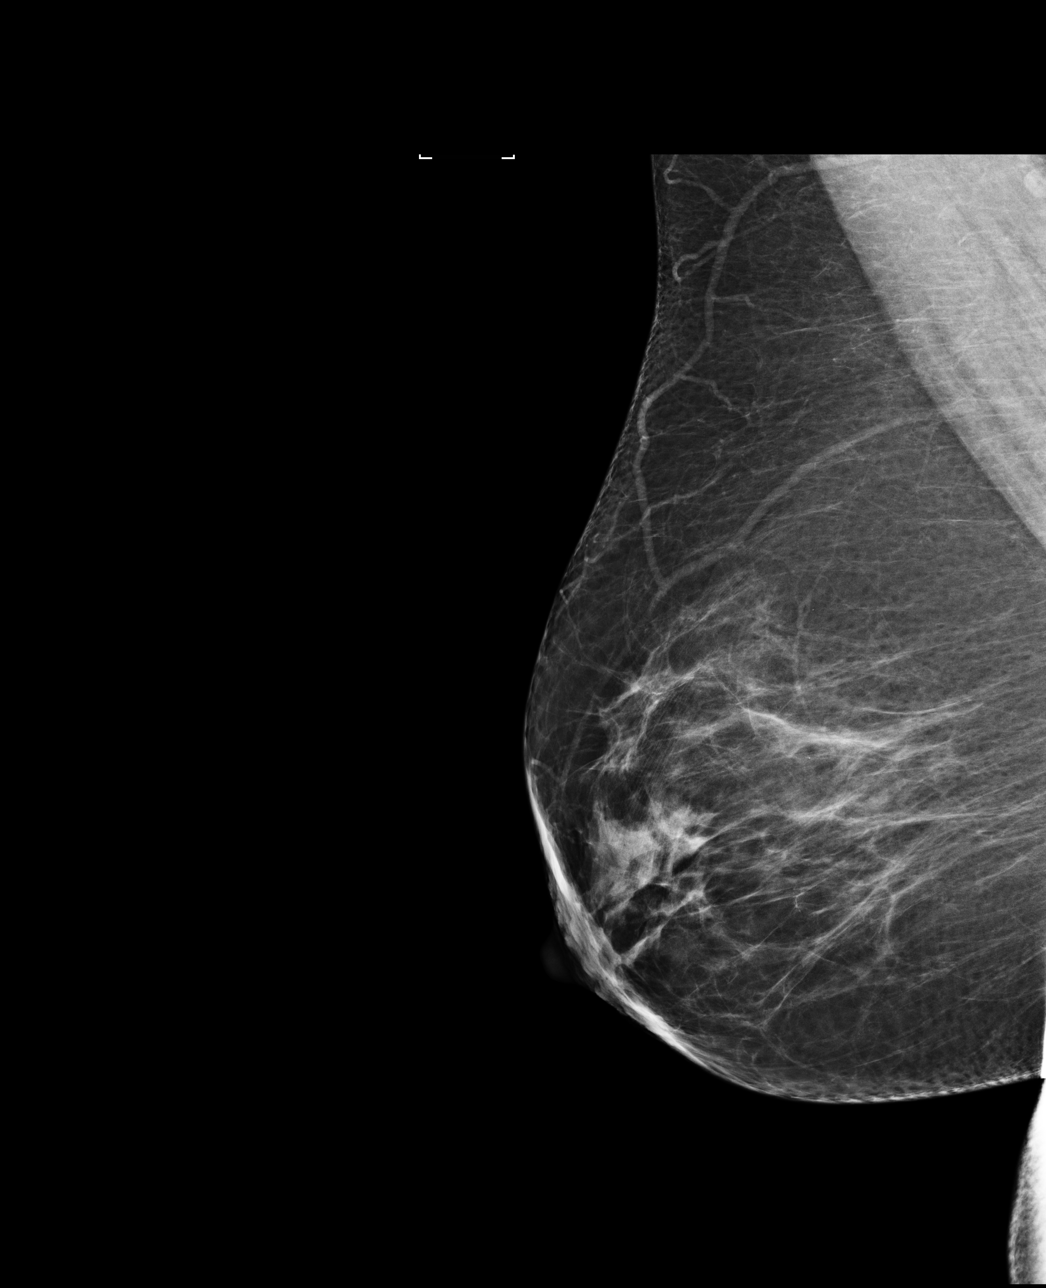

[L MLO]
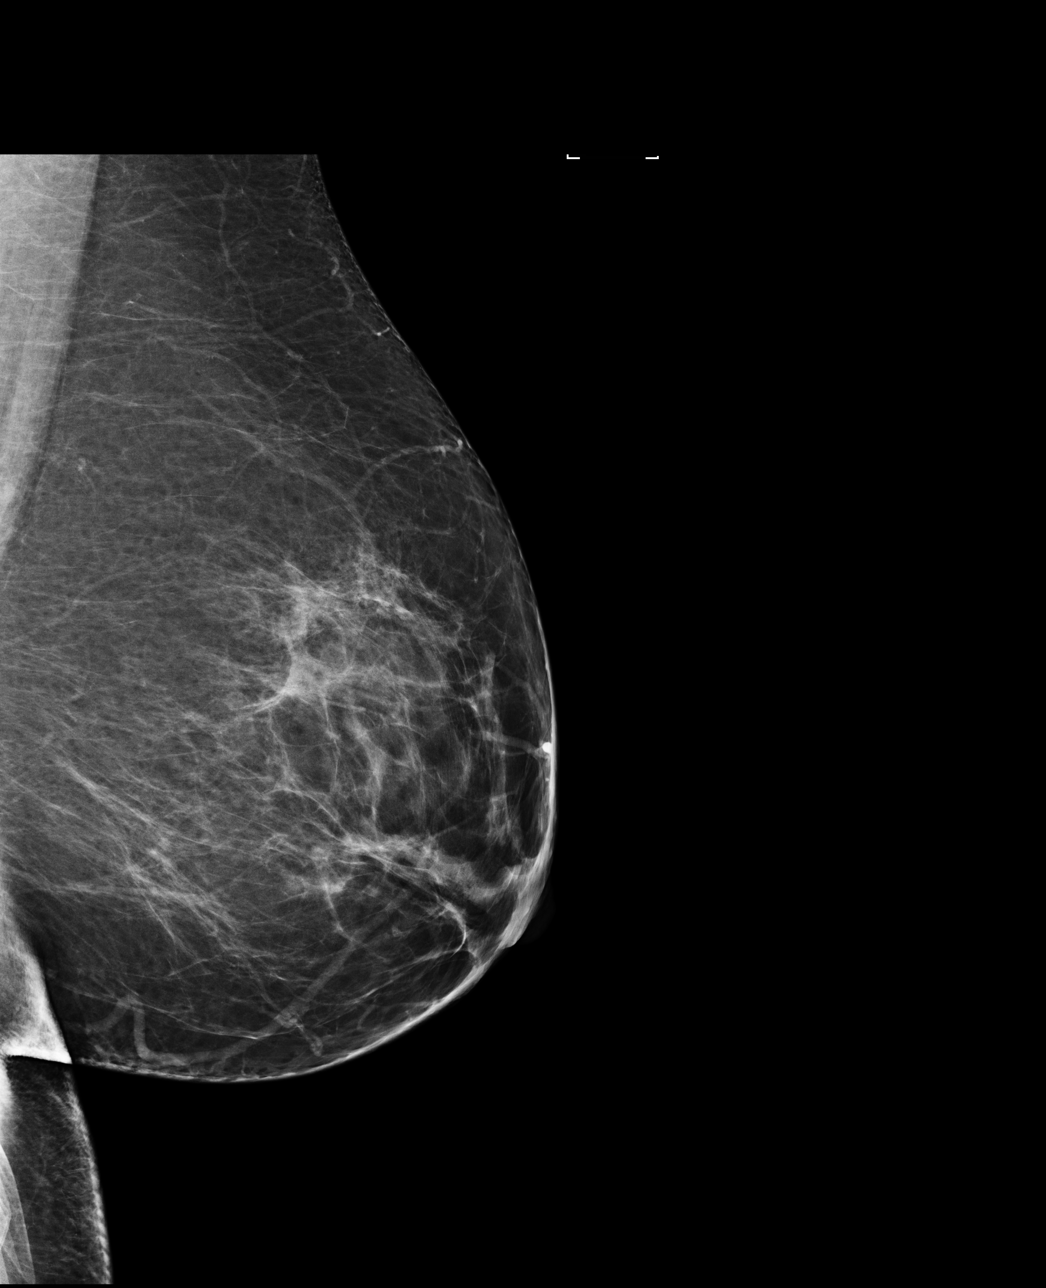

[4 of 4 positions shown; findings below may reference images not displayed]

ACR Breast Density Category b: There are scattered areas of
fibroglandular density.
FINDINGS: There are no findings suspicious for malignancy. Images were
processed with CAD.
IMPRESSION: No mammographic evidence of malignancy. A result letter of this
screening mammogram will be mailed directly to the patient.

RECOMMENDATION:
Screening mammogram in one year. (Code:AS-G-LCT)

BI-RADS CATEGORY  1: Negative.

## 2020-05-12 ENCOUNTER — Other Ambulatory Visit: Payer: Self-pay | Admitting: Family Medicine

## 2020-05-12 DIAGNOSIS — R109 Unspecified abdominal pain: Secondary | ICD-10-CM

## 2020-06-01 ENCOUNTER — Ambulatory Visit
Admission: RE | Admit: 2020-06-01 | Discharge: 2020-06-01 | Disposition: A | Discharge status: Home / Self Care | Payer: 59 | Source: Ambulatory Visit | Attending: Family Medicine | Admitting: Family Medicine

## 2020-06-01 ENCOUNTER — Other Ambulatory Visit: Payer: Self-pay

## 2020-06-01 DIAGNOSIS — R109 Unspecified abdominal pain: Secondary | ICD-10-CM

## 2020-06-01 MED ORDER — IOPAMIDOL (ISOVUE-300) INJECTION 61%
100.0000 mL | Freq: Once | INTRAVENOUS | Status: AC | PRN
  Administered 2020-06-01: 100 mL via INTRAVENOUS

## 2021-09-01 ENCOUNTER — Other Ambulatory Visit: Payer: Self-pay | Admitting: Family Medicine

## 2021-09-01 DIAGNOSIS — Z1231 Encounter for screening mammogram for malignant neoplasm of breast: Secondary | ICD-10-CM

## 2021-09-10 ENCOUNTER — Ambulatory Visit
Admission: RE | Admit: 2021-09-10 | Discharge: 2021-09-10 | Disposition: A | Discharge status: Home / Self Care | Payer: 59 | Source: Ambulatory Visit | Attending: Family Medicine | Admitting: Family Medicine

## 2021-09-10 DIAGNOSIS — Z1231 Encounter for screening mammogram for malignant neoplasm of breast: Secondary | ICD-10-CM

## 2022-06-15 DIAGNOSIS — E7800 Pure hypercholesterolemia, unspecified: Secondary | ICD-10-CM | POA: Diagnosis not present

## 2022-06-15 DIAGNOSIS — E1169 Type 2 diabetes mellitus with other specified complication: Secondary | ICD-10-CM | POA: Diagnosis not present

## 2022-06-15 DIAGNOSIS — Z0000 Encounter for general adult medical examination without abnormal findings: Secondary | ICD-10-CM | POA: Diagnosis not present

## 2022-06-15 DIAGNOSIS — R809 Proteinuria, unspecified: Secondary | ICD-10-CM | POA: Diagnosis not present

## 2022-06-15 DIAGNOSIS — J329 Chronic sinusitis, unspecified: Secondary | ICD-10-CM | POA: Diagnosis not present

## 2022-08-05 DIAGNOSIS — M9903 Segmental and somatic dysfunction of lumbar region: Secondary | ICD-10-CM | POA: Diagnosis not present

## 2022-08-05 DIAGNOSIS — M9902 Segmental and somatic dysfunction of thoracic region: Secondary | ICD-10-CM | POA: Diagnosis not present

## 2022-08-05 DIAGNOSIS — M9905 Segmental and somatic dysfunction of pelvic region: Secondary | ICD-10-CM | POA: Diagnosis not present

## 2022-08-05 DIAGNOSIS — M9901 Segmental and somatic dysfunction of cervical region: Secondary | ICD-10-CM | POA: Diagnosis not present

## 2022-08-14 DIAGNOSIS — H938X3 Other specified disorders of ear, bilateral: Secondary | ICD-10-CM | POA: Diagnosis not present

## 2022-08-22 DIAGNOSIS — J0110 Acute frontal sinusitis, unspecified: Secondary | ICD-10-CM | POA: Diagnosis not present

## 2022-08-30 DIAGNOSIS — J329 Chronic sinusitis, unspecified: Secondary | ICD-10-CM | POA: Diagnosis not present

## 2022-08-30 DIAGNOSIS — J3489 Other specified disorders of nose and nasal sinuses: Secondary | ICD-10-CM | POA: Diagnosis not present

## 2022-09-13 DIAGNOSIS — Z8719 Personal history of other diseases of the digestive system: Secondary | ICD-10-CM | POA: Diagnosis not present

## 2022-10-11 DIAGNOSIS — K080 Exfoliation of teeth due to systemic causes: Secondary | ICD-10-CM | POA: Diagnosis not present

## 2022-11-23 DIAGNOSIS — D132 Benign neoplasm of duodenum: Secondary | ICD-10-CM | POA: Diagnosis not present

## 2022-11-23 DIAGNOSIS — D123 Benign neoplasm of transverse colon: Secondary | ICD-10-CM | POA: Diagnosis not present

## 2022-11-23 DIAGNOSIS — K5732 Diverticulitis of large intestine without perforation or abscess without bleeding: Secondary | ICD-10-CM | POA: Diagnosis not present

## 2022-11-23 DIAGNOSIS — K5730 Diverticulosis of large intestine without perforation or abscess without bleeding: Secondary | ICD-10-CM | POA: Diagnosis not present

## 2022-11-25 DIAGNOSIS — D123 Benign neoplasm of transverse colon: Secondary | ICD-10-CM | POA: Diagnosis not present

## 2022-11-30 ENCOUNTER — Other Ambulatory Visit: Payer: Self-pay | Admitting: Family Medicine

## 2022-11-30 DIAGNOSIS — Z0000 Encounter for general adult medical examination without abnormal findings: Secondary | ICD-10-CM

## 2022-12-14 DIAGNOSIS — S90931A Unspecified superficial injury of right great toe, initial encounter: Secondary | ICD-10-CM | POA: Diagnosis not present

## 2022-12-14 DIAGNOSIS — J329 Chronic sinusitis, unspecified: Secondary | ICD-10-CM | POA: Diagnosis not present

## 2022-12-14 DIAGNOSIS — L089 Local infection of the skin and subcutaneous tissue, unspecified: Secondary | ICD-10-CM | POA: Diagnosis not present

## 2022-12-28 ENCOUNTER — Ambulatory Visit: Payer: 59

## 2022-12-29 ENCOUNTER — Other Ambulatory Visit: Payer: Self-pay | Admitting: Family Medicine

## 2022-12-29 DIAGNOSIS — Z794 Long term (current) use of insulin: Secondary | ICD-10-CM | POA: Diagnosis not present

## 2022-12-29 DIAGNOSIS — E1169 Type 2 diabetes mellitus with other specified complication: Secondary | ICD-10-CM | POA: Diagnosis not present

## 2022-12-29 DIAGNOSIS — E2839 Other primary ovarian failure: Secondary | ICD-10-CM

## 2022-12-29 DIAGNOSIS — E1165 Type 2 diabetes mellitus with hyperglycemia: Secondary | ICD-10-CM | POA: Diagnosis not present

## 2022-12-29 DIAGNOSIS — F330 Major depressive disorder, recurrent, mild: Secondary | ICD-10-CM | POA: Diagnosis not present

## 2022-12-29 DIAGNOSIS — I10 Essential (primary) hypertension: Secondary | ICD-10-CM | POA: Diagnosis not present

## 2022-12-29 DIAGNOSIS — I700 Atherosclerosis of aorta: Secondary | ICD-10-CM | POA: Diagnosis not present

## 2023-01-03 ENCOUNTER — Ambulatory Visit
Admission: RE | Admit: 2023-01-03 | Discharge: 2023-01-03 | Disposition: A | Discharge status: Home / Self Care | Payer: Medicare Other | Source: Ambulatory Visit | Attending: Family Medicine | Admitting: Family Medicine

## 2023-01-03 DIAGNOSIS — Z0000 Encounter for general adult medical examination without abnormal findings: Secondary | ICD-10-CM

## 2023-01-03 DIAGNOSIS — Z1231 Encounter for screening mammogram for malignant neoplasm of breast: Secondary | ICD-10-CM | POA: Diagnosis not present

## 2023-01-04 DIAGNOSIS — L309 Dermatitis, unspecified: Secondary | ICD-10-CM | POA: Diagnosis not present

## 2023-01-23 DIAGNOSIS — K080 Exfoliation of teeth due to systemic causes: Secondary | ICD-10-CM | POA: Diagnosis not present

## 2023-02-02 DIAGNOSIS — M9902 Segmental and somatic dysfunction of thoracic region: Secondary | ICD-10-CM | POA: Diagnosis not present

## 2023-02-02 DIAGNOSIS — M9905 Segmental and somatic dysfunction of pelvic region: Secondary | ICD-10-CM | POA: Diagnosis not present

## 2023-02-02 DIAGNOSIS — M9903 Segmental and somatic dysfunction of lumbar region: Secondary | ICD-10-CM | POA: Diagnosis not present

## 2023-02-02 DIAGNOSIS — M9901 Segmental and somatic dysfunction of cervical region: Secondary | ICD-10-CM | POA: Diagnosis not present

## 2023-02-10 DIAGNOSIS — M9902 Segmental and somatic dysfunction of thoracic region: Secondary | ICD-10-CM | POA: Diagnosis not present

## 2023-02-10 DIAGNOSIS — M9905 Segmental and somatic dysfunction of pelvic region: Secondary | ICD-10-CM | POA: Diagnosis not present

## 2023-02-10 DIAGNOSIS — M9903 Segmental and somatic dysfunction of lumbar region: Secondary | ICD-10-CM | POA: Diagnosis not present

## 2023-02-10 DIAGNOSIS — M9901 Segmental and somatic dysfunction of cervical region: Secondary | ICD-10-CM | POA: Diagnosis not present

## 2023-02-27 DIAGNOSIS — M79674 Pain in right toe(s): Secondary | ICD-10-CM | POA: Diagnosis not present

## 2023-02-27 DIAGNOSIS — J069 Acute upper respiratory infection, unspecified: Secondary | ICD-10-CM | POA: Diagnosis not present

## 2023-02-27 DIAGNOSIS — R509 Fever, unspecified: Secondary | ICD-10-CM | POA: Diagnosis not present

## 2023-02-27 DIAGNOSIS — R051 Acute cough: Secondary | ICD-10-CM | POA: Diagnosis not present

## 2023-02-27 DIAGNOSIS — Z03818 Encounter for observation for suspected exposure to other biological agents ruled out: Secondary | ICD-10-CM | POA: Diagnosis not present

## 2023-03-02 DIAGNOSIS — R519 Headache, unspecified: Secondary | ICD-10-CM | POA: Diagnosis not present

## 2023-03-02 DIAGNOSIS — E119 Type 2 diabetes mellitus without complications: Secondary | ICD-10-CM | POA: Diagnosis not present

## 2023-03-02 DIAGNOSIS — Z6834 Body mass index (BMI) 34.0-34.9, adult: Secondary | ICD-10-CM | POA: Diagnosis not present

## 2023-03-02 DIAGNOSIS — G8929 Other chronic pain: Secondary | ICD-10-CM | POA: Diagnosis not present

## 2023-03-03 ENCOUNTER — Other Ambulatory Visit: Payer: Self-pay | Admitting: Family Medicine

## 2023-03-03 DIAGNOSIS — R519 Headache, unspecified: Secondary | ICD-10-CM

## 2023-03-28 ENCOUNTER — Ambulatory Visit
Admission: RE | Admit: 2023-03-28 | Discharge: 2023-03-28 | Disposition: A | Discharge status: Home / Self Care | Payer: Medicare Other | Source: Ambulatory Visit | Attending: Family Medicine | Admitting: Family Medicine

## 2023-03-28 DIAGNOSIS — R519 Headache, unspecified: Secondary | ICD-10-CM

## 2023-03-28 DIAGNOSIS — I6782 Cerebral ischemia: Secondary | ICD-10-CM | POA: Diagnosis not present

## 2023-03-28 DIAGNOSIS — I639 Cerebral infarction, unspecified: Secondary | ICD-10-CM | POA: Diagnosis not present

## 2023-03-28 MED ORDER — GADOPICLENOL 0.5 MMOL/ML IV SOLN
10.0000 mL | Freq: Once | INTRAVENOUS | Status: AC | PRN
  Administered 2023-03-28: 10 mL via INTRAVENOUS

## 2023-04-17 ENCOUNTER — Other Ambulatory Visit (HOSPITAL_COMMUNITY): Payer: Self-pay | Admitting: Family Medicine

## 2023-04-17 DIAGNOSIS — I6381 Other cerebral infarction due to occlusion or stenosis of small artery: Secondary | ICD-10-CM

## 2023-04-20 ENCOUNTER — Ambulatory Visit: Payer: Medicare Other | Admitting: Diagnostic Neuroimaging

## 2023-04-20 ENCOUNTER — Encounter: Payer: Self-pay | Admitting: Diagnostic Neuroimaging

## 2023-04-20 VITALS — BP 126/82 | HR 88 | Ht 65.0 in | Wt 209.2 lb

## 2023-04-20 DIAGNOSIS — I6381 Other cerebral infarction due to occlusion or stenosis of small artery: Secondary | ICD-10-CM

## 2023-04-20 DIAGNOSIS — M5481 Occipital neuralgia: Secondary | ICD-10-CM

## 2023-04-20 DIAGNOSIS — G44209 Tension-type headache, unspecified, not intractable: Secondary | ICD-10-CM | POA: Diagnosis not present

## 2023-04-20 MED ORDER — GABAPENTIN 100 MG PO CAPS: 100.0000 mg | ORAL_CAPSULE | Freq: Every day | ORAL | 3 refills | Status: DC

## 2023-04-20 NOTE — Progress Notes (Signed)
[facility]  PATIENT: Brittany Campos DOB: 1955/07/02  REFERRING CLINICIAN: Inez Pilgrim, NP HISTORY FROM: patient  REASON FOR VISIT: new consult    HISTORICAL  CHIEF COMPLAINT:  Chief Complaint  Patient presents with   New Patient (Initial Visit)    Patient in room #6 and alone.  Patient states she here to discuss her history of stroke. Patient she been having very bad headaches lately and has pressure sore in her on her scrap.    HISTORY OF PRESENT ILLNESS:   67 year old female here for evaluation of headaches and abnormal MRI.  For past 6 months patient has had onset of bilateral occipital headaches, tenderness, tension, sometimes radiating to the top and sides of her head.  Sometimes has some mild nausea with this.  Has been having increasing stress at home related to taking care of 34 year old mother-in-law with dementia.  Has been having higher blood pressure readings in the last few months.  1 day she woke up and before she opened her eyes saw firework type sparkles in her visual field associated with severe headache.  Patient had MRI of the brain which showed an acute to subacute ischemic infarct within the right thalamus.  Patient denies any left-sided numbness or weakness sensations.   REVIEW OF SYSTEMS: Full 14 system review of systems performed and negative with exception of: as per HPI.  ALLERGIES: Allergies  Allergen Reactions   Metformin And Related Diarrhea   Ampicillin Rash   Doxycycline Rash    HOME MEDICATIONS: Outpatient Medications Prior to Visit  Medication Sig Dispense Refill   aspirin 81 MG chewable tablet Chew 81 mg by mouth daily.     betamethasone valerate (VALISONE) 0.1 % cream Apply 1 Application topically 2 (two) times daily.     Calcium Carb-Cholecalciferol (CALTRATE 600+D3) 600-20 MG-MCG TABS 1 tablet with a meal Orally Once a day for 30 day(s)     cetirizine (ZYRTEC ALLERGY) 10 MG tablet Take 10 mg by mouth daily.      clotrimazole-betamethasone (LOTRISONE) cream Apply 1 Application topically 2 (two) times daily.     FLUoxetine (PROZAC) 20 MG capsule Take 20 mg by mouth daily.     fluticasone (FLONASE) 50 MCG/ACT nasal spray Place 2 sprays into the nose as needed. For stuffiness     glimepiride (AMARYL) 4 MG tablet Take 4 mg by mouth 2 (two) times daily.     ibuprofen (ADVIL,MOTRIN) 200 MG tablet Take 400-600 mg by mouth 2 (two) times daily as needed. For pain     insulin glargine (LANTUS) 100 UNIT/ML injection Inject 100 Units into the skin 2 (two) times daily.     Insulin Syringe-Needle U-100 (B-D INS SYR HALF-UNIT .3CC/31G) 31G X 5/16" 0.3 ML MISC as directed Use daily with insulin for 90 days     ipratropium (ATROVENT) 0.06 % nasal spray Place 2 sprays into the nose 3 (three) times daily.     lisinopril-hydrochlorothiazide (PRINZIDE,ZESTORETIC) 20-12.5 MG per tablet Take 2 tablets by mouth daily.     Multiple Vitamins-Minerals (MULTIVITAMIN WITH MINERALS) tablet Take 1 tablet by mouth daily.     omeprazole (PRILOSEC OTC) 20 MG tablet Take 20 mg by mouth 2 (two) times daily as needed. For acid reflux     omeprazole (PRILOSEC OTC) 20 MG tablet Take 20 mg by mouth daily.     Pyridoxine HCl (VITAMIN B-6 PO) Take 2 tablets by mouth daily.     TRULICITY 0.75 MG/0.5ML SOAJ 0.75 mg.  valsartan (DIOVAN) 80 MG tablet Take 80 mg by mouth daily.     verapamil (CALAN) 80 MG tablet Take 80 mg by mouth daily.     Ascorbic Acid (VITAMIN C) 1000 MG tablet Take 1,000 mg by mouth daily. (Patient not taking: Reported on 04/20/2023)     buPROPion (WELLBUTRIN XL) 300 MG 24 hr tablet Take 300 mg by mouth daily.     ciprofloxacin (CIPRO) 500 MG tablet Take 1 tablet (500 mg total) by mouth every 12 (twelve) hours. (Patient not taking: Reported on 04/20/2023) 14 tablet 0   meloxicam (MOBIC) 15 MG tablet Take 15 mg by mouth daily. (Patient not taking: Reported on 04/20/2023)     potassium chloride (K-DUR) 10 MEQ tablet Take 1 tablet  (10 mEq total) by mouth 2 (two) times daily. (Patient not taking: Reported on 04/20/2023) 30 tablet 0   pseudoephedrine-guaifenesin (MUCINEX D) 60-600 MG per tablet Take 1 tablet by mouth 2 (two) times daily as needed. For congestion (Patient not taking: Reported on 04/20/2023)     No facility-administered medications prior to visit.    PAST MEDICAL HISTORY: Past Medical History:  Diagnosis Date   Allergic sinusitis    Arthritis    BPPV (benign paroxysmal positional vertigo)    Cardiomegaly    Depression    Diabetes mellitus without complication (HCC)    Diverticulitis    Esophagitis    GERD (gastroesophageal reflux disease)    Hypercholesterolemia    Hypertension    Left breast mass    Lumbar herniated disc    OSA (obstructive sleep apnea)    Tendinitis of left rotator cuff    Tennis elbow    Tongue ulcer    Type 2 diabetes mellitus with other specified complication (HCC)    Vitamin D deficiency     PAST SURGICAL HISTORY: Past Surgical History:  Procedure Laterality Date   CARPAL TUNNEL RELEASE     CHOLECYSTECTOMY      FAMILY HISTORY: Family History  Problem Relation Age of Onset   Cancer - Colon Mother    Hypertension Mother    Cancer - Prostate Father    High Cholesterol Father    Hypertension Father    Diabetes Father    Healthy Brother    Stroke Paternal Grandmother    Colon cancer Maternal Aunt    Healthy Son    Healthy Daughter     SOCIAL HISTORY: Social History   Socioeconomic History   Marital status: Married    Spouse name: Not on file   Number of children: Not on file   Years of education: Not on file   Highest education level: Not on file  Occupational History   Not on file  Tobacco Use   Smoking status: Never   Smokeless tobacco: Never  Substance and Sexual Activity   Alcohol use: Yes   Drug use: No   Sexual activity: Not on file  Other Topics Concern   Not on file  Social History Narrative   Not on file   Social Determinants of  Health   Financial Resource Strain: Not on file  Food Insecurity: Not on file  Transportation Needs: Not on file  Physical Activity: Not on file  Stress: Not on file  Social Connections: Not on file  Intimate Partner Violence: Not on file     PHYSICAL EXAM  GENERAL EXAM/CONSTITUTIONAL: Vitals:  Vitals:   04/20/23 1607  BP: 126/82  Pulse: 88  Weight: 209 lb 3.2 oz (94.9 kg)  Height: 5\' 5"  (1.651 m)   Body mass index is 34.81 kg/m. Wt Readings from Last 3 Encounters:  04/20/23 209 lb 3.2 oz (94.9 kg)  11/27/19 214 lb (97.1 kg)  09/26/19 214 lb 12.8 oz (97.4 kg)   Patient is in no distress; well developed, nourished and groomed; neck is supple  CARDIOVASCULAR: Examination of carotid arteries is normal; no carotid bruits Regular rate and rhythm, no murmurs Examination of peripheral vascular system by observation and palpation is normal  EYES: Ophthalmoscopic exam of optic discs and posterior segments is normal; no papilledema or hemorrhages No results found.  MUSCULOSKELETAL: Gait, strength, tone, movements noted in Neurologic exam below  NEUROLOGIC: MENTAL STATUS:      No data to display         awake, alert, oriented to person, place and time recent and remote memory intact normal attention and concentration language fluent, comprehension intact, naming intact fund of knowledge appropriate  CRANIAL NERVE:  2nd - no papilledema on fundoscopic exam 2nd, 3rd, 4th, 6th - pupils equal and reactive to light, visual fields full to confrontation, extraocular muscles intact, no nystagmus 5th - facial sensation symmetric 7th - facial strength symmetric 8th - hearing intact 9th - palate elevates symmetrically, uvula midline 11th - shoulder shrug symmetric 12th - tongue protrusion midline  MOTOR:  normal bulk and tone, full strength in the BUE, BLE  SENSORY:  normal and symmetric to light touch, temperature, vibration  COORDINATION:  finger-nose-finger,  fine finger movements normal  REFLEXES:  deep tendon reflexes TRACE and symmetric  GAIT/STATION:  narrow based gait     DIAGNOSTIC DATA (LABS, IMAGING, TESTING) - I reviewed patient records, labs, notes, testing and imaging myself where available.  Lab Results  Component Value Date   WBC 8.2 07/01/2012   HGB 13.4 07/01/2012   HCT 37.9 07/01/2012   MCV 86.5 07/01/2012   PLT 240 07/01/2012      Component Value Date/Time   NA 134 (L) 07/01/2012 1820   K 3.0 (L) 07/01/2012 1820   CL 98 07/01/2012 1820   CO2 25 07/01/2012 1820   GLUCOSE 173 (H) 07/01/2012 1820   BUN 23 07/01/2012 1820   CREATININE 1.01 07/01/2012 1820   CALCIUM 9.4 07/01/2012 1820   PROT 7.2 07/01/2012 1820   ALBUMIN 3.6 07/01/2012 1820   AST 25 07/01/2012 1820   ALT 32 07/01/2012 1820   ALKPHOS 71 07/01/2012 1820   BILITOT 0.4 07/01/2012 1820   GFRNONAA 61 (L) 07/01/2012 1820   GFRAA 71 (L) 07/01/2012 1820   No results found for: "CHOL", "HDL", "LDLCALC", "LDLDIRECT", "TRIG", "CHOLHDL" No results found for: "HGBA1C" No results found for: "VITAMINB12" No results found for: "TSH"   03/28/23 MRI brain [I reviewed images myself and agree with interpretation. -VRP]  1. Small acute or early subacute infarct in the right thalamus. No significant edema or mass effect. 2. Moderate chronic microvascular ischemic disease.   ASSESSMENT AND PLAN  67 y.o. year old female here with hypertension, diabetes, with bilateral occipital headaches, tension headaches, in setting of increased blood pressure over the past 6 months.  Also with incidentally noted right thalamic ischemic infarct, clinically silent on MRI brain.   Dx:  1. Right thalamic infarction (HCC)   2. Tension headache   3. Bilateral occipital neuralgia      PLAN:  RIGHT THALAMIC INFARCT (clinically silent) - continue aspirin, BP, DM control; consider statin per PCP and labs - follow up carotid u/s and echocardiogram  BILATERAL  HEADACHES  (occipital neuralgia, tension HA, hypertensive HA) - trial of gabapentin 100mg  at bedtime; may increase to 300mg  at bedtime  Meds ordered this encounter  Medications   gabapentin (NEURONTIN) 100 MG capsule    Sig: Take 1-3 capsules (100-300 mg total) by mouth at bedtime.    Dispense:  60 capsule    Refill:  3   Return for pending if symptoms worsen or fail to improve.    Suanne Marker, MD 04/20/2023, 4:46 PM Certified in Neurology, Neurophysiology and Neuroimaging  [facility] [address], [address] [place], [place] [address] [phone]

## 2023-04-20 NOTE — Patient Instructions (Signed)
  RIGHT THALAMIC INFARCT (clinically silent) - continue aspirin, BP, DM control; consider statin per PCP and labs - follow up carotid u/s and echocardiogram  BILATERAL HEADACHES (occipital neuralgia, tension HA, hypertensive HA) - trial of gabapentin 100mg  at bedtime; may increase to 300mg  at bedtime

## 2023-05-02 DIAGNOSIS — M9905 Segmental and somatic dysfunction of pelvic region: Secondary | ICD-10-CM | POA: Diagnosis not present

## 2023-05-02 DIAGNOSIS — M9901 Segmental and somatic dysfunction of cervical region: Secondary | ICD-10-CM | POA: Diagnosis not present

## 2023-05-02 DIAGNOSIS — M9902 Segmental and somatic dysfunction of thoracic region: Secondary | ICD-10-CM | POA: Diagnosis not present

## 2023-05-02 DIAGNOSIS — M9903 Segmental and somatic dysfunction of lumbar region: Secondary | ICD-10-CM | POA: Diagnosis not present

## 2023-05-08 ENCOUNTER — Ambulatory Visit (HOSPITAL_BASED_OUTPATIENT_CLINIC_OR_DEPARTMENT_OTHER): Payer: Medicare Other

## 2023-05-08 DIAGNOSIS — I6381 Other cerebral infarction due to occlusion or stenosis of small artery: Secondary | ICD-10-CM | POA: Diagnosis not present

## 2023-05-08 LAB — ECHOCARDIOGRAM COMPLETE
Area-P 1/2: 4.57 cm2
Calc EF: 54.9 %
S' Lateral: 3.04 cm
Single Plane A2C EF: 53.2 %
Single Plane A4C EF: 60 %

## 2023-05-09 DIAGNOSIS — M9905 Segmental and somatic dysfunction of pelvic region: Secondary | ICD-10-CM | POA: Diagnosis not present

## 2023-05-09 DIAGNOSIS — M9901 Segmental and somatic dysfunction of cervical region: Secondary | ICD-10-CM | POA: Diagnosis not present

## 2023-05-09 DIAGNOSIS — M9902 Segmental and somatic dysfunction of thoracic region: Secondary | ICD-10-CM | POA: Diagnosis not present

## 2023-05-09 DIAGNOSIS — M9903 Segmental and somatic dysfunction of lumbar region: Secondary | ICD-10-CM | POA: Diagnosis not present

## 2023-05-18 DIAGNOSIS — M9901 Segmental and somatic dysfunction of cervical region: Secondary | ICD-10-CM | POA: Diagnosis not present

## 2023-05-18 DIAGNOSIS — M9903 Segmental and somatic dysfunction of lumbar region: Secondary | ICD-10-CM | POA: Diagnosis not present

## 2023-05-18 DIAGNOSIS — M9905 Segmental and somatic dysfunction of pelvic region: Secondary | ICD-10-CM | POA: Diagnosis not present

## 2023-05-18 DIAGNOSIS — M9902 Segmental and somatic dysfunction of thoracic region: Secondary | ICD-10-CM | POA: Diagnosis not present

## 2023-05-23 DIAGNOSIS — M9901 Segmental and somatic dysfunction of cervical region: Secondary | ICD-10-CM | POA: Diagnosis not present

## 2023-05-23 DIAGNOSIS — M9905 Segmental and somatic dysfunction of pelvic region: Secondary | ICD-10-CM | POA: Diagnosis not present

## 2023-05-23 DIAGNOSIS — M9902 Segmental and somatic dysfunction of thoracic region: Secondary | ICD-10-CM | POA: Diagnosis not present

## 2023-05-23 DIAGNOSIS — M9903 Segmental and somatic dysfunction of lumbar region: Secondary | ICD-10-CM | POA: Diagnosis not present

## 2023-07-06 DIAGNOSIS — M7918 Myalgia, other site: Secondary | ICD-10-CM | POA: Diagnosis not present

## 2023-07-06 DIAGNOSIS — Z23 Encounter for immunization: Secondary | ICD-10-CM | POA: Diagnosis not present

## 2023-07-06 DIAGNOSIS — Z6835 Body mass index (BMI) 35.0-35.9, adult: Secondary | ICD-10-CM | POA: Diagnosis not present

## 2023-07-06 DIAGNOSIS — E559 Vitamin D deficiency, unspecified: Secondary | ICD-10-CM | POA: Diagnosis not present

## 2023-07-06 DIAGNOSIS — R5383 Other fatigue: Secondary | ICD-10-CM | POA: Diagnosis not present

## 2023-07-06 DIAGNOSIS — Z1159 Encounter for screening for other viral diseases: Secondary | ICD-10-CM | POA: Diagnosis not present

## 2023-07-06 DIAGNOSIS — E1169 Type 2 diabetes mellitus with other specified complication: Secondary | ICD-10-CM | POA: Diagnosis not present

## 2023-07-06 DIAGNOSIS — E1129 Type 2 diabetes mellitus with other diabetic kidney complication: Secondary | ICD-10-CM | POA: Diagnosis not present

## 2023-07-06 DIAGNOSIS — I10 Essential (primary) hypertension: Secondary | ICD-10-CM | POA: Diagnosis not present

## 2023-07-06 DIAGNOSIS — Z0000 Encounter for general adult medical examination without abnormal findings: Secondary | ICD-10-CM | POA: Diagnosis not present

## 2023-08-17 DIAGNOSIS — M5387 Other specified dorsopathies, lumbosacral region: Secondary | ICD-10-CM | POA: Diagnosis not present

## 2023-08-17 DIAGNOSIS — M4604 Spinal enthesopathy, thoracic region: Secondary | ICD-10-CM | POA: Diagnosis not present

## 2023-08-17 DIAGNOSIS — M9902 Segmental and somatic dysfunction of thoracic region: Secondary | ICD-10-CM | POA: Diagnosis not present

## 2023-08-17 DIAGNOSIS — M9901 Segmental and somatic dysfunction of cervical region: Secondary | ICD-10-CM | POA: Diagnosis not present

## 2023-08-24 DIAGNOSIS — M5387 Other specified dorsopathies, lumbosacral region: Secondary | ICD-10-CM | POA: Diagnosis not present

## 2023-08-24 DIAGNOSIS — M9902 Segmental and somatic dysfunction of thoracic region: Secondary | ICD-10-CM | POA: Diagnosis not present

## 2023-08-24 DIAGNOSIS — M4604 Spinal enthesopathy, thoracic region: Secondary | ICD-10-CM | POA: Diagnosis not present

## 2023-08-24 DIAGNOSIS — M9901 Segmental and somatic dysfunction of cervical region: Secondary | ICD-10-CM | POA: Diagnosis not present

## 2023-08-29 DIAGNOSIS — M65311 Trigger thumb, right thumb: Secondary | ICD-10-CM | POA: Diagnosis not present

## 2023-09-07 DIAGNOSIS — M9901 Segmental and somatic dysfunction of cervical region: Secondary | ICD-10-CM | POA: Diagnosis not present

## 2023-09-07 DIAGNOSIS — M9903 Segmental and somatic dysfunction of lumbar region: Secondary | ICD-10-CM | POA: Diagnosis not present

## 2023-09-07 DIAGNOSIS — M9902 Segmental and somatic dysfunction of thoracic region: Secondary | ICD-10-CM | POA: Diagnosis not present

## 2023-09-07 DIAGNOSIS — M9905 Segmental and somatic dysfunction of pelvic region: Secondary | ICD-10-CM | POA: Diagnosis not present

## 2023-09-12 DIAGNOSIS — E119 Type 2 diabetes mellitus without complications: Secondary | ICD-10-CM | POA: Diagnosis not present

## 2023-09-12 DIAGNOSIS — M9902 Segmental and somatic dysfunction of thoracic region: Secondary | ICD-10-CM | POA: Diagnosis not present

## 2023-09-12 DIAGNOSIS — M9903 Segmental and somatic dysfunction of lumbar region: Secondary | ICD-10-CM | POA: Diagnosis not present

## 2023-09-12 DIAGNOSIS — M9905 Segmental and somatic dysfunction of pelvic region: Secondary | ICD-10-CM | POA: Diagnosis not present

## 2023-09-12 DIAGNOSIS — M9901 Segmental and somatic dysfunction of cervical region: Secondary | ICD-10-CM | POA: Diagnosis not present

## 2023-09-19 DIAGNOSIS — M9903 Segmental and somatic dysfunction of lumbar region: Secondary | ICD-10-CM | POA: Diagnosis not present

## 2023-09-19 DIAGNOSIS — M9901 Segmental and somatic dysfunction of cervical region: Secondary | ICD-10-CM | POA: Diagnosis not present

## 2023-09-19 DIAGNOSIS — M9905 Segmental and somatic dysfunction of pelvic region: Secondary | ICD-10-CM | POA: Diagnosis not present

## 2023-09-19 DIAGNOSIS — M9902 Segmental and somatic dysfunction of thoracic region: Secondary | ICD-10-CM | POA: Diagnosis not present

## 2023-09-21 ENCOUNTER — Ambulatory Visit
Admission: RE | Admit: 2023-09-21 | Discharge: 2023-09-21 | Disposition: A | Discharge status: Home / Self Care | Payer: Medicare Other | Source: Ambulatory Visit | Attending: Family Medicine | Admitting: Family Medicine

## 2023-09-21 DIAGNOSIS — N958 Other specified menopausal and perimenopausal disorders: Secondary | ICD-10-CM | POA: Diagnosis not present

## 2023-09-21 DIAGNOSIS — Z780 Asymptomatic menopausal state: Secondary | ICD-10-CM | POA: Diagnosis not present

## 2023-09-21 DIAGNOSIS — E2839 Other primary ovarian failure: Secondary | ICD-10-CM

## 2023-10-12 DIAGNOSIS — E119 Type 2 diabetes mellitus without complications: Secondary | ICD-10-CM | POA: Diagnosis not present

## 2023-10-13 DIAGNOSIS — E1129 Type 2 diabetes mellitus with other diabetic kidney complication: Secondary | ICD-10-CM | POA: Diagnosis not present

## 2023-10-13 DIAGNOSIS — E559 Vitamin D deficiency, unspecified: Secondary | ICD-10-CM | POA: Diagnosis not present

## 2023-10-13 DIAGNOSIS — F330 Major depressive disorder, recurrent, mild: Secondary | ICD-10-CM | POA: Diagnosis not present

## 2023-10-13 DIAGNOSIS — I700 Atherosclerosis of aorta: Secondary | ICD-10-CM | POA: Diagnosis not present

## 2023-11-07 DIAGNOSIS — M65311 Trigger thumb, right thumb: Secondary | ICD-10-CM | POA: Diagnosis not present

## 2023-11-10 ENCOUNTER — Other Ambulatory Visit (HOSPITAL_COMMUNITY): Payer: Self-pay | Admitting: Family Medicine

## 2023-11-10 DIAGNOSIS — I6381 Other cerebral infarction due to occlusion or stenosis of small artery: Secondary | ICD-10-CM

## 2023-11-12 DIAGNOSIS — E119 Type 2 diabetes mellitus without complications: Secondary | ICD-10-CM | POA: Diagnosis not present

## 2023-12-01 ENCOUNTER — Ambulatory Visit (HOSPITAL_COMMUNITY)
Admission: RE | Admit: 2023-12-01 | Discharge: 2023-12-01 | Disposition: A | Discharge status: Home / Self Care | Source: Ambulatory Visit | Attending: Vascular Surgery | Admitting: Vascular Surgery

## 2023-12-01 DIAGNOSIS — I6381 Other cerebral infarction due to occlusion or stenosis of small artery: Secondary | ICD-10-CM

## 2023-12-12 DIAGNOSIS — E119 Type 2 diabetes mellitus without complications: Secondary | ICD-10-CM | POA: Diagnosis not present

## 2023-12-28 ENCOUNTER — Other Ambulatory Visit: Payer: Self-pay | Admitting: Family Medicine

## 2023-12-28 DIAGNOSIS — Z0000 Encounter for general adult medical examination without abnormal findings: Secondary | ICD-10-CM

## 2023-12-30 DIAGNOSIS — M79641 Pain in right hand: Secondary | ICD-10-CM | POA: Diagnosis not present

## 2024-01-09 DIAGNOSIS — M65311 Trigger thumb, right thumb: Secondary | ICD-10-CM | POA: Diagnosis not present

## 2024-01-12 DIAGNOSIS — E119 Type 2 diabetes mellitus without complications: Secondary | ICD-10-CM | POA: Diagnosis not present

## 2024-01-17 ENCOUNTER — Ambulatory Visit
Admission: RE | Admit: 2024-01-17 | Discharge: 2024-01-17 | Disposition: A | Discharge status: Home / Self Care | Source: Ambulatory Visit | Attending: Family Medicine | Admitting: Family Medicine

## 2024-01-17 DIAGNOSIS — Z1231 Encounter for screening mammogram for malignant neoplasm of breast: Secondary | ICD-10-CM | POA: Diagnosis not present

## 2024-01-17 DIAGNOSIS — Z0000 Encounter for general adult medical examination without abnormal findings: Secondary | ICD-10-CM

## 2024-02-02 DIAGNOSIS — M4604 Spinal enthesopathy, thoracic region: Secondary | ICD-10-CM | POA: Diagnosis not present

## 2024-02-02 DIAGNOSIS — M5387 Other specified dorsopathies, lumbosacral region: Secondary | ICD-10-CM | POA: Diagnosis not present

## 2024-02-02 DIAGNOSIS — M9902 Segmental and somatic dysfunction of thoracic region: Secondary | ICD-10-CM | POA: Diagnosis not present

## 2024-02-02 DIAGNOSIS — M9901 Segmental and somatic dysfunction of cervical region: Secondary | ICD-10-CM | POA: Diagnosis not present

## 2024-02-12 DIAGNOSIS — E119 Type 2 diabetes mellitus without complications: Secondary | ICD-10-CM | POA: Diagnosis not present

## 2024-02-14 DIAGNOSIS — M4604 Spinal enthesopathy, thoracic region: Secondary | ICD-10-CM | POA: Diagnosis not present

## 2024-02-20 DIAGNOSIS — M79641 Pain in right hand: Secondary | ICD-10-CM | POA: Diagnosis not present

## 2024-02-20 DIAGNOSIS — M65311 Trigger thumb, right thumb: Secondary | ICD-10-CM | POA: Diagnosis not present

## 2024-03-10 DIAGNOSIS — U071 COVID-19: Secondary | ICD-10-CM | POA: Diagnosis not present

## 2024-03-13 DIAGNOSIS — E119 Type 2 diabetes mellitus without complications: Secondary | ICD-10-CM | POA: Diagnosis not present

## 2024-03-16 ENCOUNTER — Other Ambulatory Visit: Payer: Self-pay | Admitting: Diagnostic Neuroimaging

## 2024-03-19 NOTE — Telephone Encounter (Signed)
 Last filled by patient on 02/21/24 Last office visit : 04/20/23 Next office visit : no appointment has been made. No return date listed in last visit.  - per last ov note - trial of gabapentin  100mg  at bedtime; may increase to 300mg  at bedtime

## 2024-03-21 ENCOUNTER — Other Ambulatory Visit: Payer: Self-pay

## 2024-03-21 MED ORDER — GABAPENTIN 100 MG PO CAPS: 100.0000 mg | ORAL_CAPSULE | Freq: Every day | ORAL | 3 refills | Status: AC

## 2024-03-26 DIAGNOSIS — M65311 Trigger thumb, right thumb: Secondary | ICD-10-CM | POA: Diagnosis not present

## 2024-04-09 DIAGNOSIS — Z23 Encounter for immunization: Secondary | ICD-10-CM | POA: Diagnosis not present

## 2024-04-09 DIAGNOSIS — M722 Plantar fascial fibromatosis: Secondary | ICD-10-CM | POA: Diagnosis not present

## 2024-04-09 DIAGNOSIS — I10 Essential (primary) hypertension: Secondary | ICD-10-CM | POA: Diagnosis not present

## 2024-04-09 DIAGNOSIS — E1165 Type 2 diabetes mellitus with hyperglycemia: Secondary | ICD-10-CM | POA: Diagnosis not present

## 2024-04-09 DIAGNOSIS — E7800 Pure hypercholesterolemia, unspecified: Secondary | ICD-10-CM | POA: Diagnosis not present

## 2024-04-13 DIAGNOSIS — E119 Type 2 diabetes mellitus without complications: Secondary | ICD-10-CM | POA: Diagnosis not present

## 2024-04-16 ENCOUNTER — Encounter (HOSPITAL_BASED_OUTPATIENT_CLINIC_OR_DEPARTMENT_OTHER): Payer: Self-pay | Admitting: Orthopedic Surgery

## 2024-04-17 ENCOUNTER — Encounter (HOSPITAL_BASED_OUTPATIENT_CLINIC_OR_DEPARTMENT_OTHER)
Admission: RE | Admit: 2024-04-17 | Discharge: 2024-04-17 | Disposition: A | Discharge status: Home / Self Care | Source: Ambulatory Visit | Attending: Orthopedic Surgery | Admitting: Orthopedic Surgery

## 2024-04-17 ENCOUNTER — Other Ambulatory Visit: Payer: Self-pay

## 2024-04-17 DIAGNOSIS — R9431 Abnormal electrocardiogram [ECG] [EKG]: Secondary | ICD-10-CM | POA: Insufficient documentation

## 2024-04-17 DIAGNOSIS — Z01818 Encounter for other preprocedural examination: Secondary | ICD-10-CM | POA: Diagnosis not present

## 2024-04-17 DIAGNOSIS — Z01810 Encounter for preprocedural cardiovascular examination: Secondary | ICD-10-CM | POA: Diagnosis not present

## 2024-04-17 DIAGNOSIS — Z01812 Encounter for preprocedural laboratory examination: Secondary | ICD-10-CM | POA: Diagnosis not present

## 2024-04-17 LAB — BASIC METABOLIC PANEL WITH GFR
Anion gap: 12 (ref 5–15)
BUN: 19 mg/dL (ref 8–23)
CO2: 26 mmol/L (ref 22–32)
Calcium: 9.3 mg/dL (ref 8.9–10.3)
Chloride: 100 mmol/L (ref 98–111)
Creatinine, Ser: 1.12 mg/dL — ABNORMAL HIGH (ref 0.44–1.00)
GFR, Estimated: 54 mL/min — ABNORMAL LOW (ref >60)
Glucose, Bld: 197 mg/dL — ABNORMAL HIGH (ref 70–99)
Potassium: 4.2 mmol/L (ref 3.5–5.1)
Sodium: 138 mmol/L (ref 135–145)

## 2024-04-24 ENCOUNTER — Ambulatory Visit (HOSPITAL_BASED_OUTPATIENT_CLINIC_OR_DEPARTMENT_OTHER): Admitting: Anesthesiology

## 2024-04-24 ENCOUNTER — Other Ambulatory Visit: Payer: Self-pay

## 2024-04-24 ENCOUNTER — Encounter (HOSPITAL_BASED_OUTPATIENT_CLINIC_OR_DEPARTMENT_OTHER): Payer: Self-pay | Admitting: Orthopedic Surgery

## 2024-04-24 ENCOUNTER — Encounter (HOSPITAL_BASED_OUTPATIENT_CLINIC_OR_DEPARTMENT_OTHER)
Admission: RE | Disposition: A | Discharge status: Home / Self Care | Payer: Self-pay | Source: Home / Self Care | Attending: Orthopedic Surgery

## 2024-04-24 ENCOUNTER — Ambulatory Visit (HOSPITAL_BASED_OUTPATIENT_CLINIC_OR_DEPARTMENT_OTHER)
Admission: RE | Admit: 2024-04-24 | Discharge: 2024-04-24 | Disposition: A | Discharge status: Home / Self Care | Attending: Orthopedic Surgery | Admitting: Orthopedic Surgery

## 2024-04-24 DIAGNOSIS — F32A Depression, unspecified: Secondary | ICD-10-CM | POA: Insufficient documentation

## 2024-04-24 DIAGNOSIS — M65312 Trigger thumb, left thumb: Secondary | ICD-10-CM | POA: Diagnosis not present

## 2024-04-24 DIAGNOSIS — E119 Type 2 diabetes mellitus without complications: Secondary | ICD-10-CM | POA: Insufficient documentation

## 2024-04-24 DIAGNOSIS — M1990 Unspecified osteoarthritis, unspecified site: Secondary | ICD-10-CM | POA: Diagnosis not present

## 2024-04-24 DIAGNOSIS — K219 Gastro-esophageal reflux disease without esophagitis: Secondary | ICD-10-CM | POA: Insufficient documentation

## 2024-04-24 DIAGNOSIS — G4730 Sleep apnea, unspecified: Secondary | ICD-10-CM

## 2024-04-24 DIAGNOSIS — M65311 Trigger thumb, right thumb: Secondary | ICD-10-CM | POA: Diagnosis not present

## 2024-04-24 DIAGNOSIS — I10 Essential (primary) hypertension: Secondary | ICD-10-CM

## 2024-04-24 DIAGNOSIS — Z01818 Encounter for other preprocedural examination: Secondary | ICD-10-CM

## 2024-04-24 HISTORY — PX: TRIGGER FINGER RELEASE: SHX641

## 2024-04-24 LAB — GLUCOSE, CAPILLARY
Glucose-Capillary: 139 mg/dL — ABNORMAL HIGH (ref 70–99)
Glucose-Capillary: 133 mg/dL — ABNORMAL HIGH (ref 70–99)

## 2024-04-24 SURGERY — RELEASE, A1 PULLEY, FOR TRIGGER FINGER
Anesthesia: Monitor Anesthesia Care | Site: Thumb | Laterality: Right

## 2024-04-24 MED ORDER — PROPOFOL 500 MG/50ML IV EMUL
INTRAVENOUS | Status: AC
  Filled 2024-04-24: qty 50

## 2024-04-24 MED ORDER — CEFAZOLIN SODIUM-DEXTROSE 2-4 GM/100ML-% IV SOLN
2.0000 g | INTRAVENOUS | Status: AC
  Administered 2024-04-24: 2 g via INTRAVENOUS

## 2024-04-24 MED ORDER — CEFAZOLIN SODIUM-DEXTROSE 2-4 GM/100ML-% IV SOLN
INTRAVENOUS | Status: AC
Start: 2024-04-24 — End: 2024-04-24
  Filled 2024-04-24: qty 100

## 2024-04-24 MED ORDER — DEXAMETHASONE SODIUM PHOSPHATE 4 MG/ML IJ SOLN
INTRAMUSCULAR | Status: DC | PRN
  Administered 2024-04-24: 5 mg via INTRAVENOUS

## 2024-04-24 MED ORDER — 0.9 % SODIUM CHLORIDE (POUR BTL) OPTIME
TOPICAL | Status: DC | PRN
  Administered 2024-04-24: 100 mL

## 2024-04-24 MED ORDER — OXYCODONE HCL 5 MG PO TABS: 5.0000 mg | ORAL_TABLET | Freq: Four times a day (QID) | ORAL | 0 refills | Status: AC | PRN

## 2024-04-24 MED ORDER — FENTANYL CITRATE (PF) 100 MCG/2ML IJ SOLN
INTRAMUSCULAR | Status: AC
  Filled 2024-04-24: qty 2

## 2024-04-24 MED ORDER — MIDAZOLAM HCL 2 MG/2ML IJ SOLN
INTRAMUSCULAR | Status: AC
  Filled 2024-04-24: qty 2

## 2024-04-24 MED ORDER — BUPIVACAINE HCL 0.25 % IJ SOLN
INTRAMUSCULAR | Status: DC | PRN
  Administered 2024-04-24: 10 mL

## 2024-04-24 MED ORDER — LACTATED RINGERS IV SOLN: INTRAVENOUS | Status: DC

## 2024-04-24 MED ORDER — ONDANSETRON HCL 4 MG/2ML IJ SOLN
INTRAMUSCULAR | Status: AC
  Filled 2024-04-24: qty 2

## 2024-04-24 MED ORDER — FENTANYL CITRATE (PF) 100 MCG/2ML IJ SOLN
INTRAMUSCULAR | Status: DC | PRN
  Administered 2024-04-24 (×4): 25 ug via INTRAVENOUS

## 2024-04-24 MED ORDER — ONDANSETRON HCL 4 MG/2ML IJ SOLN
INTRAMUSCULAR | Status: DC | PRN
  Administered 2024-04-24: 4 mg via INTRAVENOUS

## 2024-04-24 MED ORDER — PROPOFOL 500 MG/50ML IV EMUL
INTRAVENOUS | Status: DC | PRN
Start: 2024-04-24 — End: 2024-04-24
  Administered 2024-04-24: 50 ug/kg/min via INTRAVENOUS

## 2024-04-24 MED ORDER — MIDAZOLAM HCL 5 MG/5ML IJ SOLN
INTRAMUSCULAR | Status: DC | PRN
  Administered 2024-04-24: 2 mg via INTRAVENOUS

## 2024-04-24 SURGICAL SUPPLY — 27 items
BLADE SURG 15 STRL LF DISP TIS (BLADE) ×1 IMPLANT
BNDG COMPR ESMARK 4X3 LF (GAUZE/BANDAGES/DRESSINGS) ×1 IMPLANT
BNDG ELASTIC 2INX 5YD STR LF (GAUZE/BANDAGES/DRESSINGS) ×1 IMPLANT
CHLORAPREP W/TINT 26 (MISCELLANEOUS) ×1 IMPLANT
CORD BIPOLAR FORCEPS 12FT (ELECTRODE) ×1 IMPLANT
COVER BACK TABLE 60X90IN (DRAPES) ×1 IMPLANT
CUFF TOURN SGL QUICK 18X4 (TOURNIQUET CUFF) IMPLANT
CUFF TRNQT CYL 24X4X16.5-23 (TOURNIQUET CUFF) IMPLANT
DRAPE EXTREMITY T 121X128X90 (DISPOSABLE) ×1 IMPLANT
DRAPE SURG 17X23 STRL (DRAPES) ×1 IMPLANT
GAUZE SPONGE 4X4 12PLY STRL (GAUZE/BANDAGES/DRESSINGS) IMPLANT
GAUZE XEROFORM 1X8 LF (GAUZE/BANDAGES/DRESSINGS) ×1 IMPLANT
GLOVE BIO SURGEON STRL SZ7 (GLOVE) ×1 IMPLANT
GLOVE BIOGEL PI IND STRL 7.0 (GLOVE) ×1 IMPLANT
GOWN STRL REUS W/ TWL LRG LVL3 (GOWN DISPOSABLE) ×2 IMPLANT
NDL HYPO 25X1 1.5 SAFETY (NEEDLE) ×1 IMPLANT
NEEDLE HYPO 25X1 1.5 SAFETY (NEEDLE) ×1 IMPLANT
PACK BASIN DAY SURGERY FS (CUSTOM PROCEDURE TRAY) ×1 IMPLANT
SHEET MEDIUM DRAPE 40X70 STRL (DRAPES) ×1 IMPLANT
SOLN 0.9% NACL POUR BTL 1000ML (IV SOLUTION) ×1 IMPLANT
SUT ETHILON 4 0 PS 2 18 (SUTURE) ×1 IMPLANT
SUT VIC AB 4-0 PS2 18 (SUTURE) IMPLANT
SUT VICRYL RAPIDE 4/0 PS 2 (SUTURE) IMPLANT
SYR BULB EAR ULCER 3OZ GRN STR (SYRINGE) ×1 IMPLANT
SYR CONTROL 10ML LL (SYRINGE) ×1 IMPLANT
TOWEL GREEN STERILE FF (TOWEL DISPOSABLE) ×1 IMPLANT
UNDERPAD 30X36 HEAVY ABSORB (UNDERPADS AND DIAPERS) ×1 IMPLANT

## 2024-04-24 NOTE — Anesthesia Procedure Notes (Signed)
 Procedure Name: MAC Date/Time: 04/24/2024 10:19 AM  Performed by: Pam Macario BROCKS, CRNAPre-anesthesia Checklist: Timeout performed, Patient being monitored, Suction available, Emergency Drugs available and Patient identified Patient Re-evaluated:Patient Re-evaluated prior to induction Oxygen Delivery Method: Simple face mask Preoxygenation: Pre-oxygenation with 100% oxygen Induction Type: IV induction Placement Confirmation: breath sounds checked- equal and bilateral, CO2 detector and positive ETCO2

## 2024-04-24 NOTE — Interval H&P Note (Deleted)
 History and Physical Interval Note:  04/24/2024 9:58 AM  Brittany Campos  has presented today for surgery, with the diagnosis of Right trigger thumb.  The various methods of treatment have been discussed with the patient and family. After consideration of risks, benefits and other options for treatment, the patient has consented to  Procedure(s): RELEASE, A1 PULLEY, FOR TRIGGER FINGER (Right) as a surgical intervention.  The patient's history has been reviewed, patient examined, no change in status, stable for surgery.  I have reviewed the patient's chart and labs.  Questions were answered to the patient's satisfaction.     [name]

## 2024-04-24 NOTE — Discharge Instructions (Addendum)
Audria Nine, M.D. Hand Surgery  POST-OPERATIVE DISCHARGE INSTRUCTIONS   PRESCRIPTIONS: - You may have been given a prescription to be taken as directed for post-operative pain control.  You may also take over the counter ibuprofen/aleve and tylenol for pain. Take this as directed on the packaging. Do not exceed 3000 mg tylenol/acetaminophen in 24 hours.  Ibuprofen 600-800 mg (3-4) tablets by mouth every 6 hours as needed for pain.   OR  Aleve 2 tablets by mouth every 12 hours (twice daily) as needed for pain.   AND/OR  Tylenol 1000 mg (2 tablets) every 8 hours as needed for pain.  - Please use your pain medication carefully, as refills are limited and you may not be provided with one.  As stated above, please use over the counter pain medicine - it will also be helpful with decreasing your swelling.    ANESTHESIA: -After your surgery, post-surgical discomfort or pain is likely. This discomfort can last several days to a few weeks. At certain times of the day your discomfort may be more intense.   Did you receive a nerve block?   - A nerve block can provide pain relief for one hour to two days after your surgery. As long as the nerve block is working, you will experience little or no sensation in the area the surgeon operated on.  - As the nerve block wears off, you will begin to experience pain or discomfort. It is very important that you begin taking your prescribed pain medication before the nerve block fully wears off. Treating your pain at the first sign of the block wearing off will ensure your pain is better controlled and more tolerable when full-sensation returns. Do not wait until the pain is intolerable, as the medicine will be less effective. It is better to treat pain in advance than to try and catch up.   General Anesthesia:  If you did not receive a nerve block during your surgery, you will need to start taking your pain medication shortly after your surgery and  should continue to do so as prescribed by your surgeon.     ICE AND ELEVATION: - You may use ice for the first 48-72 hours, but it is not critical.   - Motion of your fingers is very important to decrease the swelling.  - Elevation, as much as possible for the next 48 hours, is critical for decreasing swelling as well as for pain relief. Elevation means when you are seated or lying down, you hand should be at or above your heart. When walking, the hand needs to be at or above the level of your elbow.  - If the bandage gets too tight, it may need to be loosened. Please contact our office and we will instruct you in how to do this.    SURGICAL BANDAGES:  - Keep your dressing and/or splint clean and dry at all times.  You can remove your dressing 7 days from now and change with a dry dressing or Band-Aids as needed thereafter. - You may place a plastic bag over your bandage to shower, but be careful, do not get your bandages wet.  - After the bandages have been removed, it is OK to get the stitches wet in a shower or with hand washing. Do Not soak or submerge the wound yet. Please do not use lotions or creams on the stitches.      HAND THERAPY:  - You may not need any. If you  do, we will begin this at your follow up visit in the clinic.    ACTIVITY AND WORK: - You are encouraged to move any fingers which are not in the bandage.  - Light use of the fingers is allowed to assist the other hand with daily hygiene and eating, but strong gripping or lifting is often uncomfortable and should be avoided.  - You might miss a variable period of time from work and hopefully this issue has been discussed prior to surgery. You may not do any heavy work with your affected hand for about 2 weeks.    [facility] Second Floor, [address] [place], [place] [address] [phone]    Post Anesthesia Home Care Instructions  Activity: Get plenty of rest for the remainder of the day. A  responsible individual must stay with you for 24 hours following the procedure.  For the next 24 hours, DO NOT: -Drive a car -Paediatric nurse -Drink alcoholic beverages -Take any medication unless instructed by your physician -Make any legal decisions or sign important papers.  Meals: Start with liquid foods such as gelatin or soup. Progress to regular foods as tolerated. Avoid greasy, spicy, heavy foods. If nausea and/or vomiting occur, drink only clear liquids until the nausea and/or vomiting subsides. Call your physician if vomiting continues.  Special Instructions/Symptoms: Your throat may feel dry or sore from the anesthesia or the breathing tube placed in your throat during surgery. If this causes discomfort, gargle with warm salt water. The discomfort should disappear within 24 hours.  If you had a scopolamine patch placed behind your ear for the management of post- operative nausea and/or vomiting:  1. The medication in the patch is effective for 72 hours, after which it should be removed.  Wrap patch in a tissue and discard in the trash. Wash hands thoroughly with soap and water. 2. You may remove the patch earlier than 72 hours if you experience unpleasant side effects which may include dry mouth, dizziness or visual disturbances. 3. Avoid touching the patch. Wash your hands with soap and water after contact with the patch.

## 2024-04-24 NOTE — Op Note (Signed)
   Date of Surgery: 04/24/2024  INDICATIONS: Patient is a 68 y.o.-year-old female with right thumb stenosing tenosynovitis that has failed conservative management.  Risks, benefits, and alternatives to surgery were again discussed with the patient in the preoperative area. The patient wishes to proceed with surgery.  Informed consent was signed after our discussion.   PREOPERATIVE DIAGNOSIS: Left trigger thumb  POSTOPERATIVE DIAGNOSIS: Same.  PROCEDURE:  Left thumb A1 pulley release   SURGEON: Carlin Galla, M.D.  ASSIST: None  ANESTHESIA:  Local, MAC  IV FLUIDS AND URINE: See anesthesia.  ESTIMATED BLOOD LOSS: <5 mL.  IMPLANTS: * No implants in log *   DRAINS: None  COMPLICATIONS: None  DESCRIPTION OF PROCEDURE: The patient was met in the preoperative holding area where the surgical site was marked and the informed consent form was signed.  The patient was then brought back to the operating room and remained on the stretcher.  A hand table was placed adjacent to the operative extremity and locked into place.  A tourniquet was placed on the right forearm.  A formal timeout was performed to confirm that this was the correct patient, surgical side, surgical site, and surgical procedure.  All were present and in agreement. Following formal timeout, a local block was performed using 10 mL of 0.25% plain marcaine.  The right upper extremity was then prepped and draped in the usual and sterile fashion.   The limb was exsanguinated and the tourniquet inflated to 250 mmHg.  A transverse incision was made over the A1 pulley.  The skin was incised.  Blunt dissection was used to identify the A1 pulley.  Two Ragnell retractors were placed on the radial and ulnar sides of the pulley to protect the respective neurovascular bundles.  A third Ragnell was placed at the distal aspect of the wound.  The A1 pulley was clearly identified.  Under direct visualization, the pulley was entered sharply using  a 15 blade.  The pulley was very thickened.  Tenotomy scissors were used to complete the pulley release distally to the level of the A2 pulley.  The distal retractor was then placed in the proximal aspect of the wound.  Under direct visualization, the proximal aspect of the A1 pulley was completely released. The FPL tendon was quite frayed in the area of the A1 pulley.   The tourniquet was let down and hemostasis achieved with direct pressure and bipolar electrocautery.  The wound was then thoroughly irrigated.  It was closed using 4-0 vicryl rapide sutures in a horizontal mattress fashion.  The wound was dressed with xeroform, 4x4, and an ace wrap.   The patient was reversed from sedation.  All counts were correct x 2 at the end of the procedure.  The patient was then taken to the PACU in stable condition.   POSTOPERATIVE PLAN: She will be discharged to home with appropriate pain medication and discharge instructions.  I'll see her back in 10-14 days for her first postop visit.   Carlin Galla, MD 10:58 AM

## 2024-04-24 NOTE — H&P (Deleted)
 HAND SURGERY   HPI: Patient is a 68 y.o. female who presents with left thumb stenosing tenosynovitis that has failed conservative management.  Patient denies any changes to their medical history or new systemic symptoms today.    Past Medical History:  Diagnosis Date   Allergic sinusitis    Arthritis    BPPV (benign paroxysmal positional vertigo)    Cardiomegaly    Depression    Diabetes mellitus without complication (HCC)    Diverticulitis    Esophagitis    GERD (gastroesophageal reflux disease)    Hypercholesterolemia    Hypertension    Left breast mass    Lumbar herniated disc    OSA (obstructive sleep apnea)    Tendinitis of left rotator cuff    Tennis elbow    Tongue ulcer    Type 2 diabetes mellitus with other specified complication (HCC)    Vitamin D deficiency    Past Surgical History:  Procedure Laterality Date   CARPAL TUNNEL RELEASE     CHOLECYSTECTOMY     Social History   Socioeconomic History   Marital status: Married    Spouse name: Not on file   Number of children: Not on file   Years of education: Not on file   Highest education level: Not on file  Occupational History   Not on file  Tobacco Use   Smoking status: Never   Smokeless tobacco: Never  Substance and Sexual Activity   Alcohol use: Yes    Comment: occ   Drug use: No   Sexual activity: Not on file  Other Topics Concern   Not on file  Social History Narrative   Not on file   Social Drivers of Health   Financial Resource Strain: Not on file  Food Insecurity: Not on file  Transportation Needs: Not on file  Physical Activity: Not on file  Stress: Not on file  Social Connections: Not on file   Family History  Problem Relation Age of Onset   Cancer - Colon Mother    Hypertension Mother    Cancer - Prostate Father    High Cholesterol Father    Hypertension Father    Diabetes Father    Healthy Brother    Stroke Paternal Grandmother    Colon cancer Maternal Aunt    Healthy  Son    Healthy Daughter    - negative except otherwise stated in the family history section Allergies  Allergen Reactions   Metformin And Related Diarrhea   Ampicillin Rash   Doxycycline Rash   Prior to Admission medications   Medication Sig Start Date End Date Taking? Authorizing Provider  aspirin 81 MG chewable tablet Chew 81 mg by mouth daily. 04/14/23  Yes [provider]  buPROPion (WELLBUTRIN XL) 300 MG 24 hr tablet Take 300 mg by mouth daily.   Yes [provider]  Calcium Carb-Cholecalciferol (CALTRATE 600+D3) 600-20 MG-MCG TABS 1 tablet with a meal Orally Once a day for 30 day(s)   Yes [provider]  FLUoxetine (PROZAC) 20 MG capsule Take 20 mg by mouth daily.   Yes [provider]  gabapentin  (NEURONTIN ) 100 MG capsule Take 1-3 capsules (100-300 mg total) by mouth at bedtime. 03/21/24  Yes Penumalli, Vikram R, MD  glimepiride (AMARYL) 4 MG tablet Take 4 mg by mouth 2 (two) times daily. 08/27/19  Yes [provider]  hydrochlorothiazide (HYDRODIURIL) 25 MG tablet Take 25 mg by mouth daily.   Yes [provider]  insulin  glargine (LANTUS) 100 UNIT/ML injection Inject 25 Units into the skin daily.   Yes [provider]  Multiple Vitamins-Minerals (MULTIVITAMIN WITH MINERALS) tablet Take 1 tablet by mouth daily.   Yes [provider]  omeprazole (PRILOSEC OTC) 20 MG tablet Take 20 mg by mouth daily.   Yes [provider]  rosuvastatin (CRESTOR) 5 MG tablet Take 5 mg by mouth daily.   Yes [provider]  valsartan (DIOVAN) 80 MG tablet Take 80 mg by mouth daily. 04/14/23  Yes [provider]  verapamil (CALAN) 80 MG tablet Take 80 mg by mouth daily.   Yes [provider]  betamethasone  valerate (VALISONE ) 0.1 % cream Apply 1 Application topically 2 (two) times daily. 01/04/23   [provider]  clotrimazole-betamethasone  (LOTRISONE) cream Apply 1 Application topically 2  (two) times daily. 12/24/21   [provider]  fluticasone (FLONASE) 50 MCG/ACT nasal spray Place 2 sprays into the nose as needed. For stuffiness    [provider]  Insulin Syringe-Needle U-100 (B-D INS SYR HALF-UNIT .3CC/31G) 31G X 5/16 0.3 ML MISC as directed Use daily with insulin for 90 days 09/10/20   [provider]  ipratropium (ATROVENT) 0.06 % nasal spray Place 2 sprays into the nose 3 (three) times daily.    [provider]  meloxicam (MOBIC) 15 MG tablet Take 15 mg by mouth daily. Patient not taking: Reported on 04/20/2023    [provider]   No results found. - Positive ROS: All other systems have been reviewed and were otherwise negative with the exception of those mentioned in the HPI and as above.  Physical Exam: General: No acute distress, resting comfortably Cardiovascular: BUE warm and well perfused, normal rate Respiratory: Normal WOB on RA Skin: Warm and dry Neurologic: Sensation intact distally Psychiatric: Patient is at baseline mood and affect  Left Upper Extremity TTP over thumb A1 pulley with painful and palpable nodule    Assessment: 68 yo F w/ left thumb stenosing tenosynovitis that has failed conservative management.   Plan: OR today for left thumb A1 pulley release. We again reviewed the risks of surgery which include bleeding, infection, damage to neurovascular structures, persistent symptoms, stiffness, need for additional surgery.  Informed consent was signed.  All questions were answered.   Bebe Galla, M.D. [facility] 9:56 AM

## 2024-04-24 NOTE — H&P (Signed)
 HAND SURGERY   HPI: Patient is a 68 y.o. female who presents with right thumb stenosing tenosynovitis that has failed conservative management.  Patient denies any changes to their medical history or new systemic symptoms today.    Past Medical History:  Diagnosis Date   Allergic sinusitis    Arthritis    BPPV (benign paroxysmal positional vertigo)    Cardiomegaly    Depression    Diabetes mellitus without complication (HCC)    Diverticulitis    Esophagitis    GERD (gastroesophageal reflux disease)    Hypercholesterolemia    Hypertension    Left breast mass    Lumbar herniated disc    OSA (obstructive sleep apnea)    Tendinitis of left rotator cuff    Tennis elbow    Tongue ulcer    Type 2 diabetes mellitus with other specified complication (HCC)    Vitamin D deficiency    Past Surgical History:  Procedure Laterality Date   CARPAL TUNNEL RELEASE     CHOLECYSTECTOMY     Social History   Socioeconomic History   Marital status: Married    Spouse name: Not on file   Number of children: Not on file   Years of education: Not on file   Highest education level: Not on file  Occupational History   Not on file  Tobacco Use   Smoking status: Never   Smokeless tobacco: Never  Substance and Sexual Activity   Alcohol use: Yes    Comment: occ   Drug use: No   Sexual activity: Not on file  Other Topics Concern   Not on file  Social History Narrative   Not on file   Social Drivers of Health   Financial Resource Strain: Not on file  Food Insecurity: Not on file  Transportation Needs: Not on file  Physical Activity: Not on file  Stress: Not on file  Social Connections: Not on file   Family History  Problem Relation Age of Onset   Cancer - Colon Mother    Hypertension Mother    Cancer - Prostate Father    High Cholesterol Father    Hypertension Father    Diabetes Father    Healthy Brother    Stroke Paternal Grandmother    Colon cancer Maternal Aunt    Healthy  Son    Healthy Daughter    - negative except otherwise stated in the family history section Allergies  Allergen Reactions   Metformin And Related Diarrhea   Ampicillin Rash   Doxycycline Rash   Prior to Admission medications   Medication Sig Start Date End Date Taking? Authorizing Provider  aspirin 81 MG chewable tablet Chew 81 mg by mouth daily. 04/14/23  Yes [provider]  buPROPion (WELLBUTRIN XL) 300 MG 24 hr tablet Take 300 mg by mouth daily.   Yes [provider]  Calcium Carb-Cholecalciferol (CALTRATE 600+D3) 600-20 MG-MCG TABS 1 tablet with a meal Orally Once a day for 30 day(s)   Yes [provider]  FLUoxetine (PROZAC) 20 MG capsule Take 20 mg by mouth daily.   Yes [provider]  gabapentin  (NEURONTIN ) 100 MG capsule Take 1-3 capsules (100-300 mg total) by mouth at bedtime. 03/21/24  Yes Penumalli, Vikram R, MD  glimepiride (AMARYL) 4 MG tablet Take 4 mg by mouth 2 (two) times daily. 08/27/19  Yes [provider]  hydrochlorothiazide (HYDRODIURIL) 25 MG tablet Take 25 mg by mouth daily.   Yes [provider]  insulin  glargine (LANTUS) 100 UNIT/ML injection Inject 25 Units into the skin daily.   Yes [provider]  Multiple Vitamins-Minerals (MULTIVITAMIN WITH MINERALS) tablet Take 1 tablet by mouth daily.   Yes [provider]  omeprazole (PRILOSEC OTC) 20 MG tablet Take 20 mg by mouth daily.   Yes [provider]  rosuvastatin (CRESTOR) 5 MG tablet Take 5 mg by mouth daily.   Yes [provider]  valsartan (DIOVAN) 80 MG tablet Take 80 mg by mouth daily. 04/14/23  Yes [provider]  verapamil (CALAN) 80 MG tablet Take 80 mg by mouth daily.   Yes [provider]  betamethasone  valerate (VALISONE ) 0.1 % cream Apply 1 Application topically 2 (two) times daily. 01/04/23   [provider]  clotrimazole-betamethasone  (LOTRISONE) cream Apply 1 Application topically 2  (two) times daily. 12/24/21   [provider]  fluticasone (FLONASE) 50 MCG/ACT nasal spray Place 2 sprays into the nose as needed. For stuffiness    [provider]  Insulin Syringe-Needle U-100 (B-D INS SYR HALF-UNIT .3CC/31G) 31G X 5/16 0.3 ML MISC as directed Use daily with insulin for 90 days 09/10/20   [provider]  ipratropium (ATROVENT) 0.06 % nasal spray Place 2 sprays into the nose 3 (three) times daily.    [provider]  meloxicam (MOBIC) 15 MG tablet Take 15 mg by mouth daily. Patient not taking: Reported on 04/20/2023    [provider]   No results found. - Positive ROS: All other systems have been reviewed and were otherwise negative with the exception of those mentioned in the HPI and as above.  Physical Exam: General: No acute distress, resting comfortably Cardiovascular: BUE warm and well perfused, normal rate Respiratory: Normal WOB on RA Skin: Warm and dry Neurologic: Sensation intact distally Psychiatric: Patient is at baseline mood and affect  Right Upper Extremity  TTP over thumb A1 pulley with palpable nodule and painful locking/catching.  Full and painless AROM of remaining fingers.     Assessment: 68 yo F w/ right thumb stenosing tenosynovitis  Plan: OR today for right thumb A1 pulley release. We again reviewed the risks of surgery which include bleeding, infection, damage to neurovascular structures, persistent symptoms, stiffness, delayed wound healing, need for additional surgery.  Informed consent was signed.  All questions were answered.   Bebe Galla, M.D. [facility] 10:00 AM

## 2024-04-24 NOTE — Transfer of Care (Signed)
 Immediate Anesthesia Transfer of Care Note  Patient: Brittany Campos  Procedure(s) Performed: RELEASE, A1 PULLEY, FOR TRIGGER FINGER (Right: Thumb)  Patient Location: PACU  Anesthesia Type:General  Level of Consciousness: awake  Airway & Oxygen Therapy: Patient Spontanous Breathing and Patient connected to face mask oxygen  Post-op Assessment: Report given to RN and Post -op Vital signs reviewed and stable  Post vital signs: Reviewed and stable  Last Vitals:  Vitals Value Taken Time  BP 150/90 04/24/24 10:51  Temp 36.3 C 04/24/24 10:52  Pulse 80 04/24/24 10:53  Resp 10 04/24/24 10:53  SpO2 93 % 04/24/24 10:53  Vitals shown include unfiled device data.  Last Pain:  Vitals:   04/24/24 0910  PainSc: 5          Complications: No notable events documented.

## 2024-04-24 NOTE — Anesthesia Preprocedure Evaluation (Signed)
 Anesthesia Evaluation  Patient identified by MRN, date of birth, ID band Patient awake    Reviewed: Allergy & Precautions, H&P , NPO status , Patient's Chart, lab work & pertinent test results  Airway Mallampati: II   Neck ROM: full    Dental   Pulmonary sleep apnea    breath sounds clear to auscultation       Cardiovascular hypertension,  Rhythm:regular Rate:Normal     Neuro/Psych  PSYCHIATRIC DISORDERS  Depression       GI/Hepatic ,GERD  ,,  Endo/Other  diabetes, Type 2    Renal/GU      Musculoskeletal  (+) Arthritis ,    Abdominal   Peds  Hematology   Anesthesia Other Findings   Reproductive/Obstetrics                              Anesthesia Physical Anesthesia Plan  ASA: 2  Anesthesia Plan: MAC   Post-op Pain Management:    Induction: Intravenous  PONV Risk Score and Plan: 2 and Ondansetron, Propofol infusion, Midazolam and Treatment may vary due to age or medical condition  Airway Management Planned: Simple Face Mask  Additional Equipment:   Intra-op Plan:   Post-operative Plan:   Informed Consent: I have reviewed the patients History and Physical, chart, labs and discussed the procedure including the risks, benefits and alternatives for the proposed anesthesia with the patient or authorized representative who has indicated his/her understanding and acceptance.     Dental advisory given  Plan Discussed with: CRNA, Anesthesiologist and Surgeon  Anesthesia Plan Comments:         Anesthesia Quick Evaluation

## 2024-04-24 NOTE — Interval H&P Note (Signed)
 History and Physical Interval Note:  04/24/2024 10:01 AM  Brittany Campos  has presented today for surgery, with the diagnosis of Right trigger thumb.  The various methods of treatment have been discussed with the patient and family. After consideration of risks, benefits and other options for treatment, the patient has consented to  Procedure(s): RELEASE, A1 PULLEY, FOR TRIGGER FINGER (Right) as a surgical intervention.  The patient's history has been reviewed, patient examined, no change in status, stable for surgery.  I have reviewed the patient's chart and labs.  Questions were answered to the patient's satisfaction.     [name]

## 2024-04-24 NOTE — Anesthesia Postprocedure Evaluation (Signed)
 Anesthesia Post Note  Patient: Brittany Campos  Procedure(s) Performed: RELEASE, A1 PULLEY, FOR TRIGGER FINGER (Right: Thumb)     Patient location during evaluation: PACU Anesthesia Type: MAC Level of consciousness: awake and alert Pain management: pain level controlled Vital Signs Assessment: post-procedure vital signs reviewed and stable Respiratory status: spontaneous breathing, nonlabored ventilation, respiratory function stable and patient connected to nasal cannula oxygen Cardiovascular status: stable and blood pressure returned to baseline Postop Assessment: no apparent nausea or vomiting Anesthetic complications: no   No notable events documented.  Last Vitals:  Vitals:   04/24/24 1130 04/24/24 1209  BP:  (!) 159/82  Pulse: 77 78  Resp: 10 20  Temp:  36.8 C  SpO2: 98% 93%    Last Pain:  Vitals:   04/24/24 1209  TempSrc: Temporal  PainSc: 0-No pain                 Johanan Skorupski S

## 2024-04-25 ENCOUNTER — Encounter (HOSPITAL_BASED_OUTPATIENT_CLINIC_OR_DEPARTMENT_OTHER): Payer: Self-pay | Admitting: Orthopedic Surgery

## 2024-05-27 DIAGNOSIS — E1129 Type 2 diabetes mellitus with other diabetic kidney complication: Secondary | ICD-10-CM | POA: Diagnosis not present

## 2024-05-27 DIAGNOSIS — E7800 Pure hypercholesterolemia, unspecified: Secondary | ICD-10-CM | POA: Diagnosis not present
